# Patient Record
Sex: Female | Born: 1960 | Race: Black or African American | Hispanic: No | Marital: Married | Smoking: Former smoker
Health system: Southern US, Community
[De-identification: ages and names within clinical notes are randomized; demographics above are authoritative.]

## PROBLEM LIST (undated history)

## (undated) DIAGNOSIS — M542 Cervicalgia: Secondary | ICD-10-CM

## (undated) DIAGNOSIS — J45909 Unspecified asthma, uncomplicated: Secondary | ICD-10-CM

## (undated) DIAGNOSIS — D219 Benign neoplasm of connective and other soft tissue, unspecified: Secondary | ICD-10-CM

## (undated) DIAGNOSIS — F909 Attention-deficit hyperactivity disorder, unspecified type: Secondary | ICD-10-CM

## (undated) DIAGNOSIS — M1990 Unspecified osteoarthritis, unspecified site: Secondary | ICD-10-CM

## (undated) DIAGNOSIS — K5792 Diverticulitis of intestine, part unspecified, without perforation or abscess without bleeding: Secondary | ICD-10-CM

## (undated) DIAGNOSIS — N6019 Diffuse cystic mastopathy of unspecified breast: Secondary | ICD-10-CM

## (undated) HISTORY — PX: COLONOSCOPY: SHX174

---

## 2005-09-24 ENCOUNTER — Other Ambulatory Visit: Admission: RE | Admit: 2005-09-24 | Discharge: 2005-09-24 | Payer: Self-pay | Admitting: Obstetrics and Gynecology

## 2006-01-05 ENCOUNTER — Emergency Department (HOSPITAL_COMMUNITY): Admission: EM | Admit: 2006-01-05 | Discharge: 2006-01-05 | Payer: Self-pay | Admitting: Emergency Medicine

## 2006-01-12 ENCOUNTER — Emergency Department (HOSPITAL_COMMUNITY): Admission: EM | Admit: 2006-01-12 | Discharge: 2006-01-12 | Payer: Self-pay | Admitting: Emergency Medicine

## 2006-01-30 ENCOUNTER — Encounter: Admission: RE | Admit: 2006-01-30 | Discharge: 2006-01-30 | Payer: Self-pay | Admitting: Obstetrics and Gynecology

## 2006-09-23 ENCOUNTER — Emergency Department (HOSPITAL_COMMUNITY): Admission: EM | Admit: 2006-09-23 | Discharge: 2006-09-23 | Payer: Self-pay | Admitting: Emergency Medicine

## 2006-10-14 ENCOUNTER — Emergency Department (HOSPITAL_COMMUNITY): Admission: EM | Admit: 2006-10-14 | Discharge: 2006-10-14 | Payer: Self-pay | Admitting: Emergency Medicine

## 2007-09-28 ENCOUNTER — Emergency Department (HOSPITAL_COMMUNITY): Admission: EM | Admit: 2007-09-28 | Discharge: 2007-09-28 | Payer: Self-pay | Admitting: Emergency Medicine

## 2007-10-25 ENCOUNTER — Emergency Department (HOSPITAL_COMMUNITY): Admission: EM | Admit: 2007-10-25 | Discharge: 2007-10-25 | Payer: Self-pay | Admitting: Emergency Medicine

## 2008-03-19 ENCOUNTER — Emergency Department (HOSPITAL_COMMUNITY): Admission: EM | Admit: 2008-03-19 | Discharge: 2008-03-19 | Payer: Self-pay | Admitting: Emergency Medicine

## 2008-07-28 ENCOUNTER — Emergency Department (HOSPITAL_COMMUNITY): Admission: EM | Admit: 2008-07-28 | Discharge: 2008-07-28 | Payer: Self-pay | Admitting: Emergency Medicine

## 2008-10-21 ENCOUNTER — Emergency Department (HOSPITAL_COMMUNITY): Admission: EM | Admit: 2008-10-21 | Discharge: 2008-10-21 | Payer: Self-pay | Admitting: Emergency Medicine

## 2009-02-06 ENCOUNTER — Emergency Department (HOSPITAL_COMMUNITY): Admission: EM | Admit: 2009-02-06 | Discharge: 2009-02-06 | Payer: Self-pay | Admitting: Emergency Medicine

## 2009-08-13 ENCOUNTER — Observation Stay (HOSPITAL_COMMUNITY): Admission: EM | Admit: 2009-08-13 | Discharge: 2009-08-14 | Payer: Self-pay | Admitting: Emergency Medicine

## 2009-08-13 ENCOUNTER — Ambulatory Visit: Payer: Self-pay | Admitting: Cardiology

## 2010-03-01 ENCOUNTER — Inpatient Hospital Stay (HOSPITAL_COMMUNITY): Admission: EM | Admit: 2010-03-01 | Discharge: 2010-03-05 | Payer: Self-pay | Source: Home / Self Care

## 2010-03-01 LAB — CBC
HCT: 39.4 % (ref 36.0–46.0)
Hemoglobin: 13.1 g/dL (ref 12.0–15.0)
MCH: 25.8 pg — ABNORMAL LOW (ref 26.0–34.0)
MCHC: 33.2 g/dL (ref 30.0–36.0)
MCV: 77.6 fL — ABNORMAL LOW (ref 78.0–100.0)
Platelets: 248 10*3/uL (ref 150–400)
RBC: 5.08 MIL/uL (ref 3.87–5.11)
RDW: 15.3 % (ref 11.5–15.5)
WBC: 15.6 10*3/uL — ABNORMAL HIGH (ref 4.0–10.5)

## 2010-03-01 LAB — COMPREHENSIVE METABOLIC PANEL
ALT: 35 U/L (ref 0–35)
AST: 22 U/L (ref 0–37)
Albumin: 3.8 g/dL (ref 3.5–5.2)
Alkaline Phosphatase: 83 U/L (ref 39–117)
BUN: 9 mg/dL (ref 6–23)
CO2: 25 mEq/L (ref 19–32)
Calcium: 9.4 mg/dL (ref 8.4–10.5)
Chloride: 102 mEq/L (ref 96–112)
Creatinine, Ser: 0.7 mg/dL (ref 0.4–1.2)
GFR calc Af Amer: >60 mL/min (ref >60)
GFR calc non Af Amer: >60 mL/min (ref >60)
Glucose, Bld: 103 mg/dL — ABNORMAL HIGH (ref 70–99)
Potassium: 3.5 mEq/L (ref 3.5–5.1)
Sodium: 137 mEq/L (ref 135–145)
Total Bilirubin: 0.7 mg/dL (ref 0.3–1.2)
Total Protein: 8.1 g/dL (ref 6.0–8.3)

## 2010-03-01 LAB — DIFFERENTIAL
Basophils Absolute: 0 10*3/uL (ref 0.0–0.1)
Basophils Relative: 0 % (ref 0–1)
Eosinophils Absolute: 0.2 10*3/uL (ref 0.0–0.7)
Eosinophils Relative: 1 % (ref 0–5)
Lymphocytes Relative: 18 % (ref 12–46)
Lymphs Abs: 2.8 10*3/uL (ref 0.7–4.0)
Monocytes Absolute: 1.1 10*3/uL — ABNORMAL HIGH (ref 0.1–1.0)
Monocytes Relative: 7 % (ref 3–12)
Neutro Abs: 11.5 10*3/uL — ABNORMAL HIGH (ref 1.7–7.7)
Neutrophils Relative %: 74 % (ref 43–77)

## 2010-03-01 LAB — LIPASE, BLOOD: Lipase: 16 U/L (ref 11–59)

## 2010-03-01 LAB — URINALYSIS, ROUTINE W REFLEX MICROSCOPIC
Hemoglobin, Urine: NEGATIVE
Ketones, ur: 40 mg/dL — AB
Nitrite: NEGATIVE
Protein, ur: NEGATIVE mg/dL
Specific Gravity, Urine: 1.023 (ref 1.005–1.030)
Urine Glucose, Fasting: NEGATIVE mg/dL
Urobilinogen, UA: 1 mg/dL (ref 0.0–1.0)
pH: 5.5 (ref 5.0–8.0)

## 2010-03-01 LAB — URINE MICROSCOPIC-ADD ON

## 2010-03-11 LAB — CBC
HCT: 36.6 % (ref 36.0–46.0)
HCT: 37.7 % (ref 36.0–46.0)
Hemoglobin: 11.6 g/dL — ABNORMAL LOW (ref 12.0–15.0)
Hemoglobin: 12.1 g/dL (ref 12.0–15.0)
MCH: 24.6 pg — ABNORMAL LOW (ref 26.0–34.0)
MCH: 25 pg — ABNORMAL LOW (ref 26.0–34.0)
MCHC: 31.7 g/dL (ref 30.0–36.0)
MCHC: 32.1 g/dL (ref 30.0–36.0)
MCV: 77.7 fL — ABNORMAL LOW (ref 78.0–100.0)
MCV: 77.9 fL — ABNORMAL LOW (ref 78.0–100.0)
Platelets: 247 10*3/uL (ref 150–400)
Platelets: 298 10*3/uL (ref 150–400)
RBC: 4.71 MIL/uL (ref 3.87–5.11)
RBC: 4.84 MIL/uL (ref 3.87–5.11)
RDW: 15.1 % (ref 11.5–15.5)
RDW: 15.3 % (ref 11.5–15.5)
WBC: 9.5 10*3/uL (ref 4.0–10.5)
WBC: 6.2 10*3/uL (ref 4.0–10.5)

## 2010-03-11 LAB — BASIC METABOLIC PANEL
BUN: 5 mg/dL — ABNORMAL LOW (ref 6–23)
BUN: 1 mg/dL — ABNORMAL LOW (ref 6–23)
CO2: 23 mEq/L (ref 19–32)
CO2: 23 mEq/L (ref 19–32)
Calcium: 8.8 mg/dL (ref 8.4–10.5)
Calcium: 9.2 mg/dL (ref 8.4–10.5)
Chloride: 102 mEq/L (ref 96–112)
Chloride: 111 mEq/L (ref 96–112)
Creatinine, Ser: 0.66 mg/dL (ref 0.4–1.2)
Creatinine, Ser: 0.6 mg/dL (ref 0.4–1.2)
GFR calc Af Amer: >60 mL/min (ref >60)
GFR calc Af Amer: >60 mL/min (ref >60)
GFR calc non Af Amer: >60 mL/min (ref >60)
GFR calc non Af Amer: >60 mL/min (ref >60)
Glucose, Bld: 122 mg/dL — ABNORMAL HIGH (ref 70–99)
Glucose, Bld: 109 mg/dL — ABNORMAL HIGH (ref 70–99)
Potassium: 3.6 mEq/L (ref 3.5–5.1)
Potassium: 4 mEq/L (ref 3.5–5.1)
Sodium: 136 mEq/L (ref 135–145)
Sodium: 138 mEq/L (ref 135–145)

## 2010-03-16 ENCOUNTER — Encounter: Payer: Self-pay | Admitting: Family Medicine

## 2010-05-12 LAB — POCT CARDIAC MARKERS
CKMB, poc: 1 ng/mL (ref 1.0–8.0)
CKMB, poc: <1 ng/mL — ABNORMAL LOW (ref 1.0–8.0)
Myoglobin, poc: 61.8 ng/mL (ref 12–200)
Troponin i, poc: <0.05 ng/mL (ref 0.00–0.09)
Troponin i, poc: <0.05 ng/mL (ref 0.00–0.09)
Troponin i, poc: <0.05 ng/mL (ref 0.00–0.09)

## 2010-05-12 LAB — URINALYSIS, ROUTINE W REFLEX MICROSCOPIC
Bilirubin Urine: NEGATIVE
Glucose, UA: NEGATIVE mg/dL
Hgb urine dipstick: NEGATIVE
Ketones, ur: NEGATIVE mg/dL
Nitrite: NEGATIVE
Protein, ur: NEGATIVE mg/dL
Specific Gravity, Urine: 1.021 (ref 1.005–1.030)
Urobilinogen, UA: 0.2 mg/dL (ref 0.0–1.0)
pH: 5 (ref 5.0–8.0)

## 2010-05-12 LAB — PROTIME-INR
INR: 0.99 (ref 0.00–1.49)
Prothrombin Time: 13 seconds (ref 11.6–15.2)

## 2010-05-12 LAB — POCT I-STAT, CHEM 8
BUN: 7 mg/dL (ref 6–23)
Calcium, Ion: 1.12 mmol/L (ref 1.12–1.32)
Chloride: 106 mEq/L (ref 96–112)
Creatinine, Ser: 0.6 mg/dL (ref 0.4–1.2)
Glucose, Bld: 92 mg/dL (ref 70–99)
HCT: 39 % (ref 36.0–46.0)
Hemoglobin: 13.3 g/dL (ref 12.0–15.0)
Potassium: 4 mEq/L (ref 3.5–5.1)
Sodium: 139 mEq/L (ref 135–145)
TCO2: 26 mmol/L (ref 0–100)

## 2010-05-12 LAB — POCT PREGNANCY, URINE: Preg Test, Ur: NEGATIVE

## 2010-05-12 LAB — APTT: aPTT: 25 seconds (ref 24–37)

## 2010-07-30 ENCOUNTER — Other Ambulatory Visit (HOSPITAL_COMMUNITY)
Admission: RE | Admit: 2010-07-30 | Discharge: 2010-07-30 | Disposition: A | Discharge status: Home / Self Care | Payer: BC Managed Care – PPO | Source: Ambulatory Visit | Attending: Family Medicine | Admitting: Family Medicine

## 2010-07-30 DIAGNOSIS — Z124 Encounter for screening for malignant neoplasm of cervix: Secondary | ICD-10-CM | POA: Insufficient documentation

## 2010-08-12 ENCOUNTER — Other Ambulatory Visit: Payer: Self-pay | Admitting: Family Medicine

## 2010-08-12 DIAGNOSIS — Z1231 Encounter for screening mammogram for malignant neoplasm of breast: Secondary | ICD-10-CM

## 2010-08-16 ENCOUNTER — Ambulatory Visit
Admission: RE | Admit: 2010-08-16 | Discharge: 2010-08-16 | Disposition: A | Discharge status: Home / Self Care | Payer: BC Managed Care – PPO | Source: Ambulatory Visit | Attending: Family Medicine | Admitting: Family Medicine

## 2010-08-16 DIAGNOSIS — Z1231 Encounter for screening mammogram for malignant neoplasm of breast: Secondary | ICD-10-CM

## 2010-08-26 ENCOUNTER — Ambulatory Visit: Payer: Self-pay

## 2010-11-22 LAB — URINALYSIS, ROUTINE W REFLEX MICROSCOPIC
Bilirubin Urine: NEGATIVE
Ketones, ur: NEGATIVE
Nitrite: NEGATIVE
Specific Gravity, Urine: 1.021
Urobilinogen, UA: 1

## 2010-12-03 ENCOUNTER — Emergency Department (HOSPITAL_COMMUNITY)
Admission: EM | Admit: 2010-12-03 | Discharge: 2010-12-03 | Disposition: A | Discharge status: Home / Self Care | Payer: BC Managed Care – PPO | Attending: Emergency Medicine | Admitting: Emergency Medicine

## 2010-12-03 DIAGNOSIS — M76899 Other specified enthesopathies of unspecified lower limb, excluding foot: Secondary | ICD-10-CM | POA: Insufficient documentation

## 2010-12-03 DIAGNOSIS — M25569 Pain in unspecified knee: Secondary | ICD-10-CM | POA: Insufficient documentation

## 2010-12-03 DIAGNOSIS — J45909 Unspecified asthma, uncomplicated: Secondary | ICD-10-CM | POA: Insufficient documentation

## 2010-12-03 DIAGNOSIS — M25469 Effusion, unspecified knee: Secondary | ICD-10-CM | POA: Insufficient documentation

## 2011-07-29 ENCOUNTER — Other Ambulatory Visit (HOSPITAL_COMMUNITY)
Admission: RE | Admit: 2011-07-29 | Discharge: 2011-07-29 | Disposition: A | Discharge status: Home / Self Care | Payer: BC Managed Care – PPO | Source: Ambulatory Visit | Attending: Family Medicine | Admitting: Family Medicine

## 2011-07-29 DIAGNOSIS — Z124 Encounter for screening for malignant neoplasm of cervix: Secondary | ICD-10-CM | POA: Insufficient documentation

## 2011-09-08 ENCOUNTER — Other Ambulatory Visit: Payer: Self-pay | Admitting: Family Medicine

## 2011-09-08 DIAGNOSIS — Z1231 Encounter for screening mammogram for malignant neoplasm of breast: Secondary | ICD-10-CM

## 2011-09-24 ENCOUNTER — Ambulatory Visit: Payer: BC Managed Care – PPO

## 2012-03-25 ENCOUNTER — Ambulatory Visit
Admission: RE | Admit: 2012-03-25 | Discharge: 2012-03-25 | Disposition: A | Discharge status: Home / Self Care | Payer: BC Managed Care – PPO | Source: Ambulatory Visit | Attending: Family Medicine | Admitting: Family Medicine

## 2012-03-25 DIAGNOSIS — Z1231 Encounter for screening mammogram for malignant neoplasm of breast: Secondary | ICD-10-CM

## 2012-03-26 ENCOUNTER — Other Ambulatory Visit: Payer: Self-pay | Admitting: Family Medicine

## 2012-03-26 DIAGNOSIS — R928 Other abnormal and inconclusive findings on diagnostic imaging of breast: Secondary | ICD-10-CM

## 2012-04-06 ENCOUNTER — Other Ambulatory Visit: Payer: Self-pay | Admitting: Family Medicine

## 2012-04-06 ENCOUNTER — Other Ambulatory Visit: Payer: Self-pay | Admitting: Internal Medicine

## 2012-04-06 DIAGNOSIS — R928 Other abnormal and inconclusive findings on diagnostic imaging of breast: Secondary | ICD-10-CM

## 2012-04-09 ENCOUNTER — Other Ambulatory Visit: Payer: BC Managed Care – PPO

## 2012-04-15 ENCOUNTER — Other Ambulatory Visit: Payer: BC Managed Care – PPO

## 2012-04-26 ENCOUNTER — Ambulatory Visit
Admission: RE | Admit: 2012-04-26 | Discharge: 2012-04-26 | Disposition: A | Discharge status: Home / Self Care | Payer: BC Managed Care – PPO | Source: Ambulatory Visit | Attending: Family Medicine | Admitting: Family Medicine

## 2012-04-26 DIAGNOSIS — R928 Other abnormal and inconclusive findings on diagnostic imaging of breast: Secondary | ICD-10-CM

## 2012-08-02 ENCOUNTER — Other Ambulatory Visit (HOSPITAL_COMMUNITY)
Admission: RE | Admit: 2012-08-02 | Discharge: 2012-08-02 | Disposition: A | Discharge status: Home / Self Care | Payer: BC Managed Care – PPO | Source: Ambulatory Visit | Attending: Family Medicine | Admitting: Family Medicine

## 2012-08-02 ENCOUNTER — Other Ambulatory Visit: Payer: Self-pay | Admitting: Physician Assistant

## 2012-08-02 DIAGNOSIS — Z124 Encounter for screening for malignant neoplasm of cervix: Secondary | ICD-10-CM | POA: Insufficient documentation

## 2012-12-14 ENCOUNTER — Emergency Department (HOSPITAL_COMMUNITY): Payer: BC Managed Care – PPO

## 2012-12-14 ENCOUNTER — Encounter (HOSPITAL_COMMUNITY): Payer: Self-pay | Admitting: Emergency Medicine

## 2012-12-14 ENCOUNTER — Emergency Department (HOSPITAL_COMMUNITY)
Admission: EM | Admit: 2012-12-14 | Discharge: 2012-12-14 | Disposition: A | Discharge status: Home / Self Care | Payer: BC Managed Care – PPO | Attending: Emergency Medicine | Admitting: Emergency Medicine

## 2012-12-14 DIAGNOSIS — Z79899 Other long term (current) drug therapy: Secondary | ICD-10-CM | POA: Insufficient documentation

## 2012-12-14 DIAGNOSIS — M719 Bursopathy, unspecified: Secondary | ICD-10-CM | POA: Insufficient documentation

## 2012-12-14 DIAGNOSIS — M67919 Unspecified disorder of synovium and tendon, unspecified shoulder: Secondary | ICD-10-CM | POA: Insufficient documentation

## 2012-12-14 MED ORDER — HYDROCODONE-ACETAMINOPHEN 5-325 MG PO TABS: 2.0000 | ORAL_TABLET | ORAL | Status: DC | PRN

## 2012-12-14 MED ORDER — DIAZEPAM 5 MG PO TABS: 5.0000 mg | ORAL_TABLET | Freq: Two times a day (BID) | ORAL | Status: DC

## 2012-12-14 MED ORDER — KETOROLAC TROMETHAMINE 60 MG/2ML IM SOLN
60.0000 mg | Freq: Once | INTRAMUSCULAR | Status: AC
  Administered 2012-12-14: 60 mg via INTRAMUSCULAR
  Filled 2012-12-14: qty 2

## 2012-12-14 NOTE — ED Notes (Signed)
Pt reports right shoulder pain for the past week. Reports that she thinks that she slept funny and has tried everything at home without relief. States that she has increased pain with movement. No other complaints.

## 2012-12-14 NOTE — ED Provider Notes (Signed)
CSN: [id number]     Arrival date & time 12/14/12  1153 History   This chart was scribed for non-physician practitioner Irish Elders, FNP, working with Bonnita Levan. Bernette Mayers, MD, by Yevette Edwards, ED Scribe. This patient was seen in room TR10C/TR10C and the patient's care was started at 12:57 PM.  None    Chief Complaint  Patient presents with  . Shoulder Pain    The history is provided by the patient. No language interpreter was used.  HPI Comments: Lauren Nicholson is a 52 y.o. female who presents to the Emergency Department complaining of diffuse pain to her right shoulder which has been gradually increasing for the past week. The pt wonders if she irritated her shoulder by sleeping on it incorrectly. She denies any recent falls or injuries, including any past injuries. She reports that movement increases the pain.  She reports that she intermittently experiences numbness and paresthesia to her hands bilaterally while she is sleeping. For work, the pt mops, vacuums, sweeps, and cleans blackboards. She has attempted to mitigate the pain with one dosage of aleve, but without relief. She is right-handed. The pt is a non-smoker.    History reviewed. No pertinent past medical history. History reviewed. No pertinent past surgical history. History reviewed. No pertinent family history. History  Substance Use Topics  . Smoking status: Never Smoker   . Smokeless tobacco: Not on file  . Alcohol Use: No   No OB history provided.  Review of Systems  Constitutional: Negative for fever and chills.  Musculoskeletal: Positive for arthralgias.  Neurological: Negative for weakness and numbness.    Allergies  Ciprofloxacin  Home Medications   Current Outpatient Rx  Name  Route  Sig  Dispense  Refill  . ADVAIR DISKUS 100-50 MCG/DOSE AEPB   Inhalation   Inhale 1 puff into the lungs 2 (two) times daily.         . naproxen sodium (ANAPROX) 220 MG tablet   Oral   Take 440 mg by mouth daily as  needed (for pain).          Triage Vitals: BP 113/82  Pulse 88  Temp(Src) 98 F (36.7 C) (Oral)  Resp 18  Ht 4\' 11"  (1.499 m)  Wt 194 lb 5 oz (88.14 kg)  BMI 39.23 kg/m2  SpO2 91%  Physical Exam  Nursing note and vitals reviewed. Constitutional: She is oriented to person, place, and time. She appears well-developed and well-nourished. No distress.  HENT:  Head: Normocephalic and atraumatic.  Eyes: EOM are normal.  Neck: Neck supple. No tracheal deviation present.  Cardiovascular: Normal rate.   Pulmonary/Chest: Effort normal. No respiratory distress.  Musculoskeletal: Normal range of motion.  No tenderness to anterior shoulder. No tenderness to scapula. Top of shoulder tender to palpation.  Neurological: She is alert and oriented to person, place, and time.  Skin: Skin is warm and dry.  Psychiatric: She has a normal mood and affect. Her behavior is normal.    ED Course  Procedures (including critical care time)  DIAGNOSTIC STUDIES: Oxygen Saturation is 91% on room air, normal by my interpretation.    COORDINATION OF CARE:  1:03 PM- Discussed treatment plan with patient which includes continued use of aleve along with icing and heat, the use of a sling, and a prescription for aleve, and the patient agreed to the plan.   Labs Review Labs Reviewed - No data to display Imaging Review No results found.  EKG Interpretation   None  MDM   1. Tendonitis of shoulder, right    Has a lot of repetative movements in her job. No numbness or tingling in her fingers today. Very limited ROM in her right shoulder. Suspect rotator cuff tendonitis. Sling for the next few days and then NSAID's and valium at night.   I personally performed the services described in this documentation, which was scribed in my presence. The recorded information has been reviewed and is accurate.     Irish Elders, NP 12/14/12 1311

## 2012-12-14 NOTE — ED Provider Notes (Signed)
Medical screening examination/treatment/procedure(s) were performed by non-physician practitioner and as supervising physician I was immediately available for consultation/collaboration.   Matasha Smigelski B. Chemeka Filice, MD 12/14/12 1549 

## 2013-04-18 ENCOUNTER — Emergency Department (HOSPITAL_COMMUNITY)
Admission: EM | Admit: 2013-04-18 | Discharge: 2013-04-18 | Disposition: A | Discharge status: Home / Self Care | Payer: BC Managed Care – PPO | Attending: Emergency Medicine | Admitting: Emergency Medicine

## 2013-04-18 ENCOUNTER — Emergency Department (HOSPITAL_COMMUNITY): Payer: BC Managed Care – PPO

## 2013-04-18 ENCOUNTER — Encounter (HOSPITAL_COMMUNITY): Payer: Self-pay | Admitting: Emergency Medicine

## 2013-04-18 DIAGNOSIS — Z79899 Other long term (current) drug therapy: Secondary | ICD-10-CM | POA: Insufficient documentation

## 2013-04-18 DIAGNOSIS — R109 Unspecified abdominal pain: Secondary | ICD-10-CM

## 2013-04-18 DIAGNOSIS — N83209 Unspecified ovarian cyst, unspecified side: Secondary | ICD-10-CM | POA: Insufficient documentation

## 2013-04-18 DIAGNOSIS — N83201 Unspecified ovarian cyst, right side: Secondary | ICD-10-CM

## 2013-04-18 DIAGNOSIS — Z8719 Personal history of other diseases of the digestive system: Secondary | ICD-10-CM | POA: Insufficient documentation

## 2013-04-18 DIAGNOSIS — IMO0002 Reserved for concepts with insufficient information to code with codable children: Secondary | ICD-10-CM | POA: Insufficient documentation

## 2013-04-18 DIAGNOSIS — D259 Leiomyoma of uterus, unspecified: Secondary | ICD-10-CM | POA: Insufficient documentation

## 2013-04-18 HISTORY — DX: Diverticulitis of intestine, part unspecified, without perforation or abscess without bleeding: K57.92

## 2013-04-18 LAB — WET PREP, GENITAL
Trich, Wet Prep: NONE SEEN
YEAST WET PREP: NONE SEEN

## 2013-04-18 LAB — CBC WITH DIFFERENTIAL/PLATELET
Basophils Absolute: 0 10*3/uL (ref 0.0–0.1)
Basophils Relative: 0 % (ref 0–1)
Eosinophils Absolute: 0.2 10*3/uL (ref 0.0–0.7)
Eosinophils Relative: 2 % (ref 0–5)
HCT: 43.2 % (ref 36.0–46.0)
Hemoglobin: 14.2 g/dL (ref 12.0–15.0)
LYMPHS ABS: 3.4 10*3/uL (ref 0.7–4.0)
LYMPHS PCT: 36 % (ref 12–46)
MCH: 27 pg (ref 26.0–34.0)
MCHC: 32.9 g/dL (ref 30.0–36.0)
MCV: 82.3 fL (ref 78.0–100.0)
Monocytes Absolute: 0.5 10*3/uL (ref 0.1–1.0)
Monocytes Relative: 5 % (ref 3–12)
NEUTROS ABS: 5.4 10*3/uL (ref 1.7–7.7)
Neutrophils Relative %: 57 % (ref 43–77)
PLATELETS: 271 10*3/uL (ref 150–400)
RBC: 5.25 MIL/uL — AB (ref 3.87–5.11)
RDW: 15.3 % (ref 11.5–15.5)
WBC: 9.5 10*3/uL (ref 4.0–10.5)

## 2013-04-18 LAB — COMPREHENSIVE METABOLIC PANEL
ALT: 25 U/L (ref 0–35)
AST: 24 U/L (ref 0–37)
Albumin: 4 g/dL (ref 3.5–5.2)
Alkaline Phosphatase: 91 U/L (ref 39–117)
BUN: 10 mg/dL (ref 6–23)
CALCIUM: 10 mg/dL (ref 8.4–10.5)
CO2: 23 meq/L (ref 19–32)
CREATININE: 0.6 mg/dL (ref 0.50–1.10)
Chloride: 104 mEq/L (ref 96–112)
GLUCOSE: 86 mg/dL (ref 70–99)
Potassium: 4.5 mEq/L (ref 3.7–5.3)
Sodium: 142 mEq/L (ref 137–147)
TOTAL PROTEIN: 7.9 g/dL (ref 6.0–8.3)
Total Bilirubin: 0.4 mg/dL (ref 0.3–1.2)

## 2013-04-18 LAB — URINALYSIS, ROUTINE W REFLEX MICROSCOPIC
Bilirubin Urine: NEGATIVE
Glucose, UA: NEGATIVE mg/dL
Hgb urine dipstick: NEGATIVE
Ketones, ur: NEGATIVE mg/dL
NITRITE: NEGATIVE
PH: 7 (ref 5.0–8.0)
Protein, ur: NEGATIVE mg/dL
SPECIFIC GRAVITY, URINE: 1.019 (ref 1.005–1.030)
UROBILINOGEN UA: 1 mg/dL (ref 0.0–1.0)

## 2013-04-18 LAB — LIPASE, BLOOD: LIPASE: 18 U/L (ref 11–59)

## 2013-04-18 LAB — URINE MICROSCOPIC-ADD ON

## 2013-04-18 MED ORDER — HYDROCODONE-ACETAMINOPHEN 5-325 MG PO TABS: 1.0000 | ORAL_TABLET | Freq: Four times a day (QID) | ORAL | Status: DC | PRN

## 2013-04-18 MED ORDER — MORPHINE SULFATE 4 MG/ML IJ SOLN
4.0000 mg | Freq: Once | INTRAMUSCULAR | Status: AC
  Administered 2013-04-18: 4 mg via INTRAVENOUS
  Filled 2013-04-18: qty 1

## 2013-04-18 MED ORDER — IOHEXOL 300 MG/ML  SOLN
25.0000 mL | INTRAMUSCULAR | Status: AC
  Administered 2013-04-18: 25 mL via ORAL

## 2013-04-18 MED ORDER — METRONIDAZOLE 500 MG PO TABS: 500.0000 mg | ORAL_TABLET | Freq: Two times a day (BID) | ORAL | Status: DC

## 2013-04-18 MED ORDER — ONDANSETRON HCL 4 MG/2ML IJ SOLN
4.0000 mg | INTRAMUSCULAR | Status: AC
  Administered 2013-04-18: 4 mg via INTRAVENOUS
  Filled 2013-04-18: qty 2

## 2013-04-18 MED ORDER — IBUPROFEN 600 MG PO TABS: 600.0000 mg | ORAL_TABLET | Freq: Four times a day (QID) | ORAL | Status: DC | PRN

## 2013-04-18 MED ORDER — IOHEXOL 300 MG/ML  SOLN
100.0000 mL | Freq: Once | INTRAMUSCULAR | Status: AC | PRN
  Administered 2013-04-18: 100 mL via INTRAVENOUS

## 2013-04-18 NOTE — Discharge Instructions (Signed)
Take ibuprofen for pain control. You may take Norco if pain is not controlled with ibuprofen. Followup with your OB/GYN. Return if symptoms worsen.  Abdominal Pain, Women Abdominal (stomach, pelvic, or belly) pain can be caused by many things. It is important to tell your doctor:  The location of the pain.  Does it come and go or is it present all the time?  Are there things that start the pain (eating certain foods, exercise)?  Are there other symptoms associated with the pain (fever, nausea, vomiting, diarrhea)? All of this is helpful to know when trying to find the cause of the pain. CAUSES   Stomach: virus or bacteria infection, or ulcer.  Intestine: appendicitis (inflamed appendix), regional ileitis (Crohn's disease), ulcerative colitis (inflamed colon), irritable bowel syndrome, diverticulitis (inflamed diverticulum of the colon), or cancer of the stomach or intestine.  Gallbladder disease or stones in the gallbladder.  Kidney disease, kidney stones, or infection.  Pancreas infection or cancer.  Fibromyalgia (pain disorder).  Diseases of the female organs:  Uterus: fibroid (non-cancerous) tumors or infection.  Fallopian tubes: infection or tubal pregnancy.  Ovary: cysts or tumors.  Pelvic adhesions (scar tissue).  Endometriosis (uterus lining tissue growing in the pelvis and on the pelvic organs).  Pelvic congestion syndrome (female organs filling up with blood just before the menstrual period).  Pain with the menstrual period.  Pain with ovulation (producing an egg).  Pain with an IUD (intrauterine device, birth control) in the uterus.  Cancer of the female organs.  Functional pain (pain not caused by a disease, may improve without treatment).  Psychological pain.  Depression. DIAGNOSIS  Your doctor will decide the seriousness of your pain by doing an examination.  Blood tests.  X-rays.  Ultrasound.  CT scan (computed tomography, special type of  X-ray).  MRI (magnetic resonance imaging).  Cultures, for infection.  Barium enema (dye inserted in the large intestine, to better view it with X-rays).  Colonoscopy (looking in intestine with a lighted tube).  Laparoscopy (minor surgery, looking in abdomen with a lighted tube).  Major abdominal exploratory surgery (looking in abdomen with a large incision). TREATMENT  The treatment will depend on the cause of the pain.   Many cases can be observed and treated at home.  Over-the-counter medicines recommended by your caregiver.  Prescription medicine.  Antibiotics, for infection.  Birth control pills, for painful periods or for ovulation pain.  Hormone treatment, for endometriosis.  Nerve blocking injections.  Physical therapy.  Antidepressants.  Counseling with a psychologist or psychiatrist.  Minor or major surgery. HOME CARE INSTRUCTIONS   Do not take laxatives, unless directed by your caregiver.  Take over-the-counter pain medicine only if ordered by your caregiver. Do not take aspirin because it can cause an upset stomach or bleeding.  Try a clear liquid diet (broth or water) as ordered by your caregiver. Slowly move to a bland diet, as tolerated, if the pain is related to the stomach or intestine.  Have a thermometer and take your temperature several times a day, and record it.  Bed rest and sleep, if it helps the pain.  Avoid sexual intercourse, if it causes pain.  Avoid stressful situations.  Keep your follow-up appointments and tests, as your caregiver orders.  If the pain does not go away with medicine or surgery, you may try:  Acupuncture.  Relaxation exercises (yoga, meditation).  Group therapy.  Counseling. SEEK MEDICAL CARE IF:   You notice certain foods cause stomach pain.  Your  home care treatment is not helping your pain.  You need stronger pain medicine.  You want your IUD removed.  You feel faint or lightheaded.  You  develop nausea and vomiting.  You develop a rash.  You are having side effects or an allergy to your medicine. SEEK IMMEDIATE MEDICAL CARE IF:   Your pain does not go away or gets worse.  You have a fever.  Your pain is felt only in portions of the abdomen. The right side could possibly be appendicitis. The left lower portion of the abdomen could be colitis or diverticulitis.  You are passing blood in your stools (bright red or black tarry stools, with or without vomiting).  You have blood in your urine.  You develop chills, with or without a fever.  You pass out. MAKE SURE YOU:   Understand these instructions.  Will watch your condition.  Will get help right away if you are not doing well or get worse. Document Released: 12/08/2006 Document Revised: 05/05/2011 Document Reviewed: 12/28/2008 [facility] Patient Information 2014 [place], [place].

## 2013-04-18 NOTE — ED Notes (Signed)
Patient transported to CT 

## 2013-04-18 NOTE — ED Provider Notes (Signed)
CSN: [id number]     Arrival date & time 04/18/13  9983 History   First MD Initiated Contact with Patient 04/18/13 1120     Chief Complaint  Patient presents with  . Abdominal Pain     (Consider location/radiation/quality/duration/timing/severity/associated sxs/prior Treatment) HPI Comments: Patient is a 53 year old female with a history of diverticulitis who presents to the emergency department for right lower quadrant pain x7 days. Patient states the pain has been gradually worsening since onset without any alleviating factors. Patient has not taken anything for symptoms because she thought that pain was "just gas". She endorses having similar pain in the past which was related to gas/bloating. She denies bloating sensation or abdominal distension since symptom onset. Patient states that pain is worse with movement and palpation to the area. She denies associated fever, chest pain or shortness of breath, nausea, vomiting, diarrhea, melena or hematochezia, vaginal bleeding or discharge, dysuria or hematuria, numbness/tingling, and weakness. She denies a history of abdominal surgeries.  Patient is a 53 y.o. female presenting with abdominal pain. The history is provided by the patient. No language interpreter was used.  Abdominal Pain   Past Medical History  Diagnosis Date  . Diverticulitis    History reviewed. No pertinent past surgical history. History reviewed. No pertinent family history. History  Substance Use Topics  . Smoking status: Never Smoker   . Smokeless tobacco: Not on file  . Alcohol Use: No   OB History   Grav Para Term Preterm Abortions TAB SAB Ect Mult Living                 Review of Systems  Gastrointestinal: Positive for abdominal pain.  All other systems reviewed and are negative.     Allergies  Ciprofloxacin  Home Medications   Current Outpatient Rx  Name  Route  Sig  Dispense  Refill  . ADVAIR DISKUS 100-50 MCG/DOSE AEPB   Inhalation   Inhale 1  puff into the lungs daily.          . Beclomethasone Dipropionate (QVAR IN)   Inhalation   Inhale 1 puff into the lungs 2 (two) times daily.         . Calcium Carbonate (CALCIUM 600 PO)   Oral   Take 600 mg by mouth 3 (three) times daily.         Marland Kitchen COENZYME Q-10 PO   Oral   Take 10 mLs by mouth daily.         . Multiple Vitamin (MULTIVITAMIN WITH MINERALS) TABS tablet   Oral   Take 1 tablet by mouth daily.         . Multiple Vitamins-Minerals (HAIR/SKIN/NAILS) TABS   Oral   Take 3 tablets by mouth daily.          BP 140/86  Pulse 79  Temp(Src) 97.9 F (36.6 C) (Oral)  Resp 22  Ht 4\' 11"  (1.499 m)  Wt 187 lb (84.823 kg)  BMI 37.75 kg/m2  SpO2 97%  Physical Exam  Nursing note and vitals reviewed. Constitutional: She is oriented to person, place, and time. She appears well-developed and well-nourished. No distress.  HENT:  Head: Normocephalic and atraumatic.  Eyes: Conjunctivae and EOM are normal. No scleral icterus.  Neck: Normal range of motion.  Cardiovascular: Normal rate, regular rhythm and intact distal pulses.   Pulmonary/Chest: Effort normal. No respiratory distress.  Abdominal: Soft. She exhibits no distension and no mass. There is tenderness (TTP at McBurney's point). There is no  rebound and no guarding.  Abdomen soft. Tenderness appreciated in the right lower quadrant at McBurney's point. Negative Murphy's sign. No peritoneal signs.  Genitourinary: Vagina normal. There is no rash, tenderness, lesion or injury on the right labia. There is no rash, tenderness, lesion or injury on the left labia. Uterus is not tender. Cervix exhibits no motion tenderness and no friability. Right adnexum displays no mass, no tenderness and no fullness. Left adnexum displays no mass, no tenderness and no fullness.  Musculoskeletal: Normal range of motion.  Neurological: She is alert and oriented to person, place, and time.  Skin: Skin is warm and dry. No rash noted. She  is not diaphoretic. No erythema. No pallor.  Psychiatric: She has a normal mood and affect. Her behavior is normal.    ED Course  Procedures (including critical care time) Labs Review Labs Reviewed  URINALYSIS, ROUTINE W REFLEX MICROSCOPIC - Abnormal; Notable for the following:    APPearance HAZY (*)    Leukocytes, UA SMALL (*)    All other components within normal limits  URINE MICROSCOPIC-ADD ON - Abnormal; Notable for the following:    Squamous Epithelial / LPF FEW (*)    Bacteria, UA FEW (*)    All other components within normal limits  CBC WITH DIFFERENTIAL - Abnormal; Notable for the following:    RBC 5.25 (*)    All other components within normal limits  WET PREP, GENITAL  GC/CHLAMYDIA PROBE AMP  COMPREHENSIVE METABOLIC PANEL  LIPASE, BLOOD  CBC WITH DIFFERENTIAL   Imaging Review Ct Abdomen Pelvis W Contrast  04/18/2013   CLINICAL DATA:  53 year old female with right lower quadrant abdominal pain. Initial encounter.  EXAM: CT ABDOMEN AND PELVIS WITH CONTRAST  TECHNIQUE: Multidetector CT imaging of the abdomen and pelvis was performed using the standard protocol following bolus administration of intravenous contrast.  CONTRAST:  168mL OMNIPAQUE IOHEXOL 300 MG/ML  SOLN  COMPARISON:  03/01/2010.  FINDINGS: Mild cardiomegaly. No pericardial or pleural effusion. Minor lung base atelectasis.  Moderate to severe L5-S1 facet degeneration. Vacuum facet phenomena. No acute osseous abnormality identified.  No pelvic free fluid.  Negative distal colon.  Unremarkable bladder and left ovary. Fibroid uterus, 56 mm diameter fibroid along the right posterior aspect of the uterus appears stable since 2012. There is a chronic right adnexal cystic lesion with simple fluid densitometry today measuring 34 x 48 mm (32 x 44 mm previously). No pelvic sidewall lymphadenopathy.  Sigmoid diverticulosis without active inflammation. Negative left colon. Oral contrast has reached the splenic flexure. Negative  transverse colon. Negative right colon and appendix. The cecum is on a lax mesentery. No dilated small bowel. No inflamed small bowel identified. Decompressed stomach and duodenum.  Stable liver with small 8 mm cyst or biliary hamartoma in the superficial right lobe. Negative gallbladder, spleen, pancreas, adrenal glands, and portal venous system. Major arterial structures in the abdomen and pelvis appear patent. No abdominal free fluid. Stable and negative kidneys.  IMPRESSION: 1. No acute or inflammatory process identified.  Normal appendix. 2. Chronic right adnexal cyst which was evaluated with pelvic ultrasound in 11/2020 14, with yearly ultrasound followup recommended (please see that report). 3. Fibroid uterus.  Sigmoid diverticulosis.   Electronically Signed   By: Lars Pinks M.D.   On: 04/18/2013 14:47    EKG Interpretation   None       MDM   Final diagnoses:  Abdominal pain  Ovarian cyst, right  Fibroid uterus   Patient is a 53 year old  female who presents to the emergency department for right lower abdominal pain x1 week. Patient denies associated fever, nausea, vomiting, and diarrhea. No melena or hematochezia. On arrival patient is well and nontoxic appearing, hemodynamically stable, and afebrile. Physical exam significant for focal tenderness in the right lower cautery and appreciated to be at McBurney's point. No peritoneal signs appreciated on exam. Patient without adnexal or cervical motion tenderness today.  Labs and CT scan ordered for further evaluation of patient's symptoms. Patient today has no leukocytosis, anemia, or electrolyte imbalance. Liver and kidney function preserved and lipase WNL. Urinalysis today does not suggest infection. CT abdomen and pelvis pending.  CT scan negative for acute intra-abdominal processes. Appendix is normal appearing. No evidence of bowel obstruction or hernia. Patient does have a chronic right adnexal cyst without any additional changes noted  to the adnexa. Patient also with fibroid uterus. Wet prep is currently pending. Have reviewed findings with patient who verbalizes understanding. Have discussed plan which includes OB/GYN followup as an outpatient if wet prep unremarkable. Patient agreeable to plan with no unaddressed concerns.  66 - Dr. Judithann Graves to f/u on results of wet prep. Patient stable and appropriate for d/c if wet prep unremarkable. Patient advised OBGYN follow up as outpatient.  Antonietta Breach, PA-C 04/18/13 1547

## 2013-04-18 NOTE — ED Notes (Signed)
PT states that she has a hx of diverticulitis, but it was on the other side of her abdomen. Pt states it hurts to move, lie down, and sit, but denies more pain with palpation.

## 2013-04-18 NOTE — ED Notes (Signed)
RLQ pain since Tuesday. Hx of diverticulitis with similar presentation  2012. NO n/v/d. Afebrile. ambulatory

## 2013-04-19 LAB — GC/CHLAMYDIA PROBE AMP
CT PROBE, AMP APTIMA: NEGATIVE
GC Probe RNA: NEGATIVE

## 2013-04-19 NOTE — ED Provider Notes (Signed)
Medical screening examination/treatment/procedure(s) were performed by non-physician practitioner and as supervising physician I was immediately available for consultation/collaboration.  EKG Interpretation   None        Jasper Riling. Alvino Chapel, MD 04/19/13 [phone]

## 2013-06-17 ENCOUNTER — Emergency Department (HOSPITAL_COMMUNITY): Payer: BC Managed Care – PPO

## 2013-06-17 ENCOUNTER — Encounter (HOSPITAL_COMMUNITY): Payer: Self-pay | Admitting: Emergency Medicine

## 2013-06-17 ENCOUNTER — Emergency Department (HOSPITAL_COMMUNITY)
Admission: EM | Admit: 2013-06-17 | Discharge: 2013-06-17 | Disposition: A | Discharge status: Home / Self Care | Payer: BC Managed Care – PPO | Attending: Emergency Medicine | Admitting: Emergency Medicine

## 2013-06-17 DIAGNOSIS — R0789 Other chest pain: Secondary | ICD-10-CM

## 2013-06-17 DIAGNOSIS — N644 Mastodynia: Secondary | ICD-10-CM | POA: Insufficient documentation

## 2013-06-17 DIAGNOSIS — R079 Chest pain, unspecified: Secondary | ICD-10-CM

## 2013-06-17 DIAGNOSIS — Z792 Long term (current) use of antibiotics: Secondary | ICD-10-CM | POA: Insufficient documentation

## 2013-06-17 DIAGNOSIS — IMO0002 Reserved for concepts with insufficient information to code with codable children: Secondary | ICD-10-CM | POA: Insufficient documentation

## 2013-06-17 DIAGNOSIS — J45901 Unspecified asthma with (acute) exacerbation: Secondary | ICD-10-CM | POA: Insufficient documentation

## 2013-06-17 DIAGNOSIS — E669 Obesity, unspecified: Secondary | ICD-10-CM | POA: Insufficient documentation

## 2013-06-17 DIAGNOSIS — K5732 Diverticulitis of large intestine without perforation or abscess without bleeding: Secondary | ICD-10-CM | POA: Insufficient documentation

## 2013-06-17 DIAGNOSIS — Z79899 Other long term (current) drug therapy: Secondary | ICD-10-CM | POA: Insufficient documentation

## 2013-06-17 DIAGNOSIS — R071 Chest pain on breathing: Secondary | ICD-10-CM | POA: Insufficient documentation

## 2013-06-17 HISTORY — DX: Unspecified asthma, uncomplicated: J45.909

## 2013-06-17 LAB — CBC
HCT: 39.9 % (ref 36.0–46.0)
Hemoglobin: 13.1 g/dL (ref 12.0–15.0)
MCH: 26.9 pg (ref 26.0–34.0)
MCHC: 32.8 g/dL (ref 30.0–36.0)
MCV: 81.9 fL (ref 78.0–100.0)
PLATELETS: 213 10*3/uL (ref 150–400)
RBC: 4.87 MIL/uL (ref 3.87–5.11)
RDW: 14.2 % (ref 11.5–15.5)
WBC: 6.5 10*3/uL (ref 4.0–10.5)

## 2013-06-17 LAB — I-STAT TROPONIN, ED: TROPONIN I, POC: 0.01 ng/mL (ref 0.00–0.08)

## 2013-06-17 LAB — BASIC METABOLIC PANEL
BUN: 11 mg/dL (ref 6–23)
CALCIUM: 9.6 mg/dL (ref 8.4–10.5)
CO2: 22 mEq/L (ref 19–32)
CREATININE: 0.56 mg/dL (ref 0.50–1.10)
Chloride: 103 mEq/L (ref 96–112)
Glucose, Bld: 89 mg/dL (ref 70–99)
Potassium: 4.1 mEq/L (ref 3.7–5.3)
SODIUM: 138 meq/L (ref 137–147)

## 2013-06-17 LAB — D-DIMER, QUANTITATIVE: D-Dimer, Quant: 0.55 ug/mL-FEU — ABNORMAL HIGH (ref 0.00–0.48)

## 2013-06-17 MED ORDER — IBUPROFEN 800 MG PO TABS: 800.0000 mg | ORAL_TABLET | Freq: Three times a day (TID) | ORAL | Status: DC

## 2013-06-17 MED ORDER — IOHEXOL 350 MG/ML SOLN
75.0000 mL | Freq: Once | INTRAVENOUS | Status: AC | PRN
  Administered 2013-06-17: 75 mL via INTRAVENOUS

## 2013-06-17 MED ORDER — KETOROLAC TROMETHAMINE 60 MG/2ML IM SOLN
60.0000 mg | Freq: Once | INTRAMUSCULAR | Status: AC
  Administered 2013-06-17: 60 mg via INTRAMUSCULAR
  Filled 2013-06-17: qty 2

## 2013-06-17 NOTE — ED Notes (Signed)
Antony Haste RN from PEDS called to assist with IV placement

## 2013-06-17 NOTE — ED Notes (Signed)
EDPA Robin at bedside.

## 2013-06-17 NOTE — ED Provider Notes (Signed)
CSN: [id number]     Arrival date & time 06/17/13  0725 History   First MD Initiated Contact with Patient 06/17/13 [phone]     Chief Complaint  Patient presents with  . Chest Pain     (Consider location/radiation/quality/duration/timing/severity/associated sxs/prior Treatment) HPI Comments: Patient is a 53 year old female with past medical history of asthma and diverticulitis who presents to the emergency department complaining of left-sided chest pain radiating towards her left upper back beginning 2 hours prior to arrival. 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 Pain described as a "muscle pain", constant, worse with deep inspiration and movement of her left arm. She cannot recall lifting anything or straining her muscles in any way. States she did drink caffeine this morning which she only does on occasion and took an aspirin prior to arrival with no relief of her pain. Admits to occasional shortness of breath when she moves. Denies nausea, vomiting or diaphoresis. Denies fever or cough. No calf pain or swelling. Admits to family history of early heart disease. Her mom passed away at the age of 66. Nonsmoker. Denies hx of heart disease. No hx of blood clots.  Patient is a 53 y.o. female presenting with chest pain. The history is provided by the patient.  Chest Pain Associated symptoms: shortness of breath     Past Medical History  Diagnosis Date  . Diverticulitis   . Asthma    History reviewed. No pertinent past surgical history. History reviewed. No pertinent family history. History  Substance Use Topics  . Smoking status: Never Smoker   . Smokeless tobacco: Not on file  . Alcohol Use: No   OB History   Grav Para Term Preterm Abortions TAB SAB Ect Mult Living                 Review of Systems  Respiratory: Positive for shortness of breath.   Cardiovascular: Positive for chest pain.  All other systems reviewed and are  negative.     Allergies  Ciprofloxacin  Home Medications   Prior to Admission medications   Medication Sig Start Date End Date Taking? Authorizing Provider  ADVAIR DISKUS 100-50 MCG/DOSE AEPB Inhale 1 puff into the lungs daily.  11/24/12   Historical Provider, MD  Beclomethasone Dipropionate (QVAR IN) Inhale 1 puff into the lungs 2 (two) times daily.    Historical Provider, MD  Calcium Carbonate (CALCIUM 600 PO) Take 600 mg by mouth 3 (three) times daily.    Historical Provider, MD  COENZYME Q-10 PO Take 10 mLs by mouth daily.    Historical Provider, MD  HYDROcodone-acetaminophen (NORCO/VICODIN) 5-325 MG per tablet Take 1-2 tablets by mouth every 6 (six) hours as needed. 04/18/13   Antonietta Breach, PA-C  ibuprofen (ADVIL,MOTRIN) 600 MG tablet Take 1 tablet (600 mg total) by mouth every 6 (six) hours as needed. 04/18/13   Antonietta Breach, PA-C  metroNIDAZOLE (FLAGYL) 500 MG tablet Take 1 tablet (500 mg total) by mouth 2 (two) times daily. 04/18/13   Ruthell Rummage, MD  Multiple Vitamin (MULTIVITAMIN WITH MINERALS) TABS tablet Take 1 tablet by mouth daily.    Historical Provider, MD  Multiple Vitamins-Minerals (HAIR/SKIN/NAILS) TABS Take 3 tablets by mouth daily.    Historical Provider, MD   There were no vitals taken for this visit. Physical Exam  Nursing note and vitals reviewed. Constitutional: She is oriented to person, place, and time. She appears well-developed and well-nourished. No distress.  Obese.  HENT:  Head: Normocephalic and atraumatic.  Mouth/Throat: Oropharynx is clear and moist.  Eyes: Conjunctivae and EOM are normal. Pupils are equal, round, and reactive to light.  Neck: Normal range of motion. Neck supple. No JVD present.  Cardiovascular: Normal rate, regular rhythm, normal heart sounds and intact distal pulses.   No extremity edema.  Pulmonary/Chest: Effort normal and breath sounds normal. No respiratory distress. She exhibits tenderness.  Tenderness over left lateral breast,  pt states different pain than she is experiencing.  Abdominal: Soft. Bowel sounds are normal. There is no tenderness.  Musculoskeletal: Normal range of motion. She exhibits no edema.  Upper back pain not reproducible.  Neurological: She is alert and oriented to person, place, and time. She has normal strength. No sensory deficit.  Speech fluent, goal oriented. Moves limbs without ataxia. Equal grip strength bilateral.  Skin: Skin is warm and dry. She is not diaphoretic.  Psychiatric: She has a normal mood and affect. Her behavior is normal.    ED Course  Procedures (including critical care time) Labs Review Labs Reviewed  [place], ED    Imaging Review No results found.   EKG Interpretation None      MDM   Final diagnoses:  Chest pain  Chest wall pain   Patient presenting with chest pain, pleuritic. I am unable to reproduce her chest pain. She is well appearing and in no apparent distress. Vital signs stable. No significant past medical history. Positive family history of early heart disease. Labs, CXR pending. EKG without any acute findings. 8:59 AM D-dimer elevated slightly at 5.5. Cannot PERC out negative. Will obtain CT angio. 11:41 AM CT angio negative for PE. Pt reports her pain is still present, only when she moves. Doubt cardiac, low risk HEART score 2. Stable for d/c with PCP f/u. Return precautions given. Patient states understanding of treatment care plan and is agreeable.  Case discussed with attending Dr. Vanita Panda who agrees with plan of care.   Illene Labrador, PA-C 06/17/13 1142

## 2013-06-17 NOTE — ED Provider Notes (Signed)
  This was a shared visit with a mid-level provided (NP or PA).  Throughout the patient's course I was available for consultation/collaboration.  I saw the ECG (if appropriate), relevant labs and studies - I agree with the interpretation.  On my exam the patient was in no distress.  However, with her elevated D. dimer, CT scan was performed.  This was reassuring, and the patient was discharged in stable condition to follow up with primary care.      Carmin Muskrat, MD 06/17/13 [phone]

## 2013-06-17 NOTE — ED Notes (Signed)
Pt complains of chest and back pain on left side, onset this am, sts she did drink caffeine and took asa pta, pt sts pain worse with movement of arms, admitted for obs in past for palpitations

## 2013-06-17 NOTE — ED Notes (Signed)
Patient given 2 work notes for herself and husband. Patient ambulatory, in no acute distress. Acknowledges discharge and follow-up instructions.

## 2013-06-17 NOTE — ED Notes (Signed)
Patient transported to X-ray 

## 2013-06-17 NOTE — Discharge Instructions (Signed)
Chest Pain (Nonspecific) °It is often hard to give a specific diagnosis for the cause of chest pain. There is always a chance that your pain could be related to something serious, such as a heart attack or a blood clot in the lungs. You need to follow up with your caregiver for further evaluation. °CAUSES  °· Heartburn. °· Pneumonia or bronchitis. °· Anxiety or stress. °· Inflammation around your heart (pericarditis) or lung (pleuritis or pleurisy). °· A blood clot in the lung. °· A collapsed lung (pneumothorax). It can develop suddenly on its own (spontaneous pneumothorax) or from injury (trauma) to the chest. °· Shingles infection (herpes zoster virus). °The chest wall is composed of bones, muscles, and cartilage. Any of these can be the source of the pain. °· The bones can be bruised by injury. °· The muscles or cartilage can be strained by coughing or overwork. °· The cartilage can be affected by inflammation and become sore (costochondritis). °DIAGNOSIS  °Lab tests or other studies, such as X-rays, electrocardiography, stress testing, or cardiac imaging, may be needed to find the cause of your pain.  °TREATMENT  °· Treatment depends on what may be causing your chest pain. Treatment may include: °· Acid blockers for heartburn. °· Anti-inflammatory medicine. °· Pain medicine for inflammatory conditions. °· Antibiotics if an infection is present. °· You may be advised to change lifestyle habits. This includes stopping smoking and avoiding alcohol, caffeine, and chocolate. °· You may be advised to keep your head raised (elevated) when sleeping. This reduces the chance of acid going backward from your stomach into your esophagus. °· Most of the time, nonspecific chest pain will improve within 2 to 3 days with rest and mild pain medicine. °HOME CARE INSTRUCTIONS  °· If antibiotics were prescribed, take your antibiotics as directed. Finish them even if you start to feel better. °· For the next few days, avoid physical  activities that bring on chest pain. Continue physical activities as directed. °· Do not smoke. °· Avoid drinking alcohol. °· Only take over-the-counter or prescription medicine for pain, discomfort, or fever as directed by your caregiver. °· Follow your caregiver's suggestions for further testing if your chest pain does not go away. °· Keep any follow-up appointments you made. If you do not go to an appointment, you could develop lasting (chronic) problems with pain. If there is any problem keeping an appointment, you must call to reschedule. °SEEK MEDICAL CARE IF:  °· You think you are having problems from the medicine you are taking. Read your medicine instructions carefully. °· Your chest pain does not go away, even after treatment. °· You develop a rash with blisters on your chest. °SEEK IMMEDIATE MEDICAL CARE IF:  °· You have increased chest pain or pain that spreads to your arm, neck, jaw, back, or abdomen. °· You develop shortness of breath, an increasing cough, or you are coughing up blood. °· You have severe back or abdominal pain, feel nauseous, or vomit. °· You develop severe weakness, fainting, or chills. °· You have a fever. °THIS IS AN EMERGENCY. Do not wait to see if the pain will go away. Get medical help at once. Call your local emergency services (911 in [place] Do not drive yourself to the hospital. °MAKE SURE YOU:  °· Understand these instructions. °· Will watch your condition. °· Will get help right away if you are not doing well or get worse. °Document Released: 11/20/2004 Document Revised: 05/05/2011 Document Reviewed: 09/16/2007 °ExitCare® Patient Information ©2014 [employer]   LL[employer].  Chest Wall Pain Chest wall pain is pain in or around the bones and muscles of your chest. It may take up to 6 weeks to get better. It may take longer if you must stay physically active in your work and activities.  CAUSES  Chest wall pain may happen on its own. However, it may be caused by:  A viral illness  like the flu.  Injury.  Coughing.  Exercise.  Arthritis.  Fibromyalgia.  Shingles. HOME CARE INSTRUCTIONS   Avoid overtiring physical activity. Try not to strain or perform activities that cause pain. This includes any activities using your chest or your abdominal and side muscles, especially if heavy weights are used.  Put ice on the sore area.  Put ice in a plastic bag.  Place a towel between your skin and the bag.  Leave the ice on for 15-20 minutes per hour while awake for the first 2 days.  Only take over-the-counter or prescription medicines for pain, discomfort, or fever as directed by your caregiver. SEEK IMMEDIATE MEDICAL CARE IF:   Your pain increases, or you are very uncomfortable.  You have a fever.  Your chest pain becomes worse.  You have new, unexplained symptoms.  You have nausea or vomiting.  You feel sweaty or lightheaded.  You have a cough with phlegm (sputum), or you cough up blood. MAKE SURE YOU:   Understand these instructions.  Will watch your condition.  Will get help right away if you are not doing well or get worse. Document Released: 02/10/2005 Document Revised: 05/05/2011 Document Reviewed: 10/07/2010 [facility] Patient Information 2014 [place], [place].

## 2013-06-17 NOTE — ED Notes (Addendum)
Pt presents with sudden onset of Left side chest pain radiating to her Left back, pt also reports Left shoulder shoulder tension. Pt states her symptoms started at 0600 this am after drinking a cup of coffee, pt states she drank the coffee quickly and usually does not drink coffee. Pt describes her pain as a spsam pain that worsens with movement or deep breathing. Pt reports a strong maternal family cardiac history, both mother passed away with CAD and sister had a MI at 26 years old.  Pt denies SOB, diaphoresis, nausea, vomiting, abd pain, weakness, and dizziness.

## 2013-08-19 ENCOUNTER — Other Ambulatory Visit (HOSPITAL_COMMUNITY)
Admission: RE | Admit: 2013-08-19 | Discharge: 2013-08-19 | Disposition: A | Discharge status: Home / Self Care | Payer: BC Managed Care – PPO | Source: Ambulatory Visit | Attending: Family Medicine | Admitting: Family Medicine

## 2013-08-19 ENCOUNTER — Other Ambulatory Visit: Payer: Self-pay | Admitting: Physician Assistant

## 2013-08-19 DIAGNOSIS — Z124 Encounter for screening for malignant neoplasm of cervix: Secondary | ICD-10-CM | POA: Insufficient documentation

## 2013-08-22 LAB — CYTOLOGY - PAP

## 2013-09-22 ENCOUNTER — Emergency Department (HOSPITAL_COMMUNITY)
Admission: EM | Admit: 2013-09-22 | Discharge: 2013-09-22 | Disposition: A | Discharge status: Home / Self Care | Payer: BC Managed Care – PPO | Attending: Emergency Medicine | Admitting: Emergency Medicine

## 2013-09-22 ENCOUNTER — Encounter (HOSPITAL_COMMUNITY): Payer: Self-pay | Admitting: Emergency Medicine

## 2013-09-22 DIAGNOSIS — IMO0002 Reserved for concepts with insufficient information to code with codable children: Secondary | ICD-10-CM | POA: Insufficient documentation

## 2013-09-22 DIAGNOSIS — Z87891 Personal history of nicotine dependence: Secondary | ICD-10-CM | POA: Insufficient documentation

## 2013-09-22 DIAGNOSIS — Z8719 Personal history of other diseases of the digestive system: Secondary | ICD-10-CM | POA: Insufficient documentation

## 2013-09-22 DIAGNOSIS — G5600 Carpal tunnel syndrome, unspecified upper limb: Secondary | ICD-10-CM | POA: Insufficient documentation

## 2013-09-22 DIAGNOSIS — Z79899 Other long term (current) drug therapy: Secondary | ICD-10-CM | POA: Insufficient documentation

## 2013-09-22 DIAGNOSIS — G5601 Carpal tunnel syndrome, right upper limb: Secondary | ICD-10-CM

## 2013-09-22 DIAGNOSIS — J45909 Unspecified asthma, uncomplicated: Secondary | ICD-10-CM | POA: Insufficient documentation

## 2013-09-22 DIAGNOSIS — M79609 Pain in unspecified limb: Secondary | ICD-10-CM | POA: Insufficient documentation

## 2013-09-22 MED ORDER — KETOROLAC TROMETHAMINE 30 MG/ML IJ SOLN
30.0000 mg | Freq: Once | INTRAMUSCULAR | Status: AC
  Administered 2013-09-22: 30 mg via INTRAMUSCULAR
  Filled 2013-09-22: qty 1

## 2013-09-22 MED ORDER — KETOROLAC TROMETHAMINE 30 MG/ML IJ SOLN: 30.0000 mg | Freq: Once | INTRAMUSCULAR | Status: DC

## 2013-09-22 MED ORDER — HYDROCODONE-ACETAMINOPHEN 5-325 MG PO TABS
ORAL_TABLET | ORAL | Status: DC
Start: 2013-09-22 — End: 2015-04-20

## 2013-09-22 MED ORDER — HYDROCODONE-ACETAMINOPHEN 5-325 MG PO TABS
2.0000 | ORAL_TABLET | Freq: Once | ORAL | Status: AC
  Administered 2013-09-22: 2 via ORAL
  Filled 2013-09-22: qty 2

## 2013-09-22 NOTE — ED Notes (Signed)
C/O right hand pain that began 3 weeks ago worsening last night.  Kept her up all night.  Rates pain 9/10.  Also c/o pressure left side of face since being exposed to grass all weekend long (has allergy to it).

## 2013-09-22 NOTE — ED Notes (Signed)
Puffiness noted to right hand.  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  Has been experiencing increasing pain to her hand over past three weeks but last night became so painful that she did not sleep well.  Husband had to help her get ready for work as she was unable to use right hand this morning.

## 2013-09-22 NOTE — Discharge Instructions (Signed)
Take vicodin for breakthrough pain, do not drink alcohol, drive, care for children or do other critical tasks while taking vicodin.  Please follow with your primary care doctor in the next 2 days for a check-up. They must obtain records for further management.   Do not hesitate to return to the Emergency Department for any new, worsening or concerning symptoms.    Carpal Tunnel Syndrome The carpal tunnel is a narrow area located on the palm side of your wrist. The tunnel is formed by the wrist bones and ligaments. Nerves, blood vessels, and tendons pass through the carpal tunnel. Repeated wrist motion or certain diseases may cause swelling within the tunnel. This swelling pinches the main nerve in the wrist (median nerve) and causes the painful hand and arm condition called carpal tunnel syndrome. CAUSES   Repeated wrist motions.  Wrist injuries.  Certain diseases like arthritis, diabetes, alcoholism, hyperthyroidism, and kidney failure.  Obesity.  Pregnancy. SYMPTOMS   A "pins and needles" feeling in your fingers or hand, especially in your thumb, index and middle fingers.  Tingling or numbness in your fingers or hand.  An aching feeling in your entire arm, especially when your wrist and elbow are bent for long periods of time.  Wrist pain that goes up your arm to your shoulder.  Pain that goes down into your palm or fingers.  A weak feeling in your hands. DIAGNOSIS  Your health care provider will take your history and perform a physical exam. An electromyography test may be needed. This test measures electrical signals sent out by your nerves into the muscles. The electrical signals are usually slowed by carpal tunnel syndrome. You may also need X-rays. TREATMENT  Carpal tunnel syndrome may clear up by itself. Your health care provider may recommend a wrist splint or medicine such as a nonsteroidal anti-inflammatory medicine. Cortisone injections may help. Sometimes, surgery may  be needed to free the pinched nerve.  HOME CARE INSTRUCTIONS   Take all medicine as directed by your health care provider. Only take over-the-counter or prescription medicines for pain, discomfort, or fever as directed by your health care provider.  If you were given a splint to keep your wrist from bending, wear it as directed. It is important to wear the splint at night. Wear the splint for as long as you have pain or numbness in your hand, arm, or wrist. This may take 1 to 2 months.  Rest your wrist from any activity that may be causing your pain. If your symptoms are work-related, you may need to talk to your employer about changing to a job that does not require using your wrist.  Put ice on your wrist after long periods of wrist activity.  Put ice in a plastic bag.  Place a towel between your skin and the bag.  Leave the ice on for 15-20 minutes, 03-04 times a day.  Keep all follow-up visits as directed by your health care provider. This includes any orthopedic referrals, physical therapy, and rehabilitation. Any delay in getting necessary care could result in a delay or failure of your condition to heal. SEEK IMMEDIATE MEDICAL CARE IF:   You have new, unexplained symptoms.  Your symptoms get worse and are not helped or controlled with medicines. MAKE SURE YOU:   Understand these instructions.  Will watch your condition.  Will get help right away if you are not doing well or get worse. Document Released: 02/08/2000 Document Revised: 06/27/2013 Document Reviewed: 12/27/2010 ExitCare Patient Information  2015 [employer]. This information is not intended to replace advice given to you by your health care provider. Make sure you discuss any questions you have with your health care provider.

## 2013-09-22 NOTE — ED Provider Notes (Signed)
CSN: [id number]     Arrival date & time 09/22/13  0608 History   None    Chief Complaint  Patient presents with  . Hand Pain     (Consider location/radiation/quality/duration/timing/severity/associated sxs/prior Treatment) HPI  Lauren Nicholson is a 53 y.o. female complaining of complaining of severe right handed pain worsening over the course of 24 hours, this started approximately 3 months ago. Patient is right-hand dominant. 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 Patient has been wearing a brace with little relief. She states that there is a numbness and tingling to the radial nerve distribution. Patient prior similar episodes but never this severe. No pain medications prior to arrival.   Past Medical History  Diagnosis Date  . Diverticulitis   . Asthma    History reviewed. No pertinent past surgical history. History reviewed. No pertinent family history. History  Substance Use Topics  . Smoking status: Former Smoker    Quit date: 09/23/1998  . Smokeless tobacco: Not on file  . Alcohol Use: No   OB History   Grav Para Term Preterm Abortions TAB SAB Ect Mult Living                 Review of Systems  10 systems reviewed and found to be negative, except as noted in the HPI.  Allergies  Ciprofloxacin  Home Medications   Prior to Admission medications   Medication Sig Start Date End Date Taking? Authorizing Provider  albuterol (PROVENTIL HFA;VENTOLIN HFA) 108 (90 BASE) MCG/ACT inhaler Inhale 1-2 puffs into the lungs every 6 (six) hours as needed for wheezing or shortness of breath.   Yes Historical Provider, MD  cetirizine (ZYRTEC) 10 MG tablet Take 10 mg by mouth daily.   Yes Historical Provider, MD  Fluticasone-Salmeterol (ADVAIR) 100-50 MCG/DOSE AEPB Inhale 1 puff into the lungs 2 (two) times daily.   Yes Historical Provider, MD  albuterol (PROVENTIL) (2.5 MG/3ML) 0.083% nebulizer solution Take 2.5 mg by  nebulization every 6 (six) hours as needed for wheezing or shortness of breath.    Historical Provider, MD   BP 131/77  Pulse 102  Temp(Src) 97.9 F (36.6 C) (Oral)  Resp 16  Ht 4\' 11"  (1.499 m)  Wt 186 lb (84.369 kg)  BMI 37.55 kg/m2  SpO2 98% Physical Exam  Nursing note and vitals reviewed. Constitutional: She is oriented to person, place, and time. She appears well-developed and well-nourished. No distress.  HENT:  Head: Normocephalic and atraumatic.  Mouth/Throat: Oropharynx is clear and moist.  Eyes: Conjunctivae and EOM are normal. Pupils are equal, round, and reactive to light.  Neck: Normal range of motion.  Cardiovascular: Normal rate, regular rhythm and intact distal pulses.   Pulmonary/Chest: Effort normal and breath sounds normal. No stridor. No respiratory distress. She has no wheezes. She has no rales. She exhibits no tenderness.  Abdominal: Soft. Bowel sounds are normal. She exhibits no distension and no mass. There is no tenderness. There is no rebound and no guarding.  Musculoskeletal: Normal range of motion.  There is very slight swelling to the right hand. She is neurovascularly intact. Patient has limited range of motion in the wrist. Patient has positive Phalen and Tinel test on the right side.  Neurological: She is alert and oriented to person, place, and time.  Psychiatric: She has a normal mood and affect.    ED Course  Procedures (including critical care time) Labs Review Labs Reviewed -  No data to display  Imaging Review No results found.   EKG Interpretation None      MDM   Final diagnoses:  None    Filed Vitals:   09/22/13 0624 09/22/13 0700 09/22/13 0756  BP: 131/77 137/82 128/76  Pulse: 102 87   Temp: 97.9 F (36.6 C)  98 F (36.7 C)  TempSrc: Oral  Oral  Resp: 16    Height: 4\' 11"  (1.499 m)    Weight: 186 lb (84.369 kg)    SpO2: 98% 98% 98%    Medications  HYDROcodone-acetaminophen (NORCO/VICODIN) 5-325 MG per tablet 2  tablet (2 tablets Oral Given 09/22/13 0724)  ketorolac (TORADOL) 30 MG/ML injection 30 mg (30 mg Intramuscular Given 09/22/13 0724)    TUJUANA Nicholson is a 53 y.o. female presenting with Right hand pain, repetitive use syndrome. Physical exam with positive Phalen and Tinnel test.patient has been using wrist splint. Encourage her to use high-dose NSAIDs to reduce inflammation. I'm also going to write her for Norco for pain control. I have asked her to check in with her primary care physician and also given her a referral to hand surgeon Dr. Burney Gauze.   Evaluation does not show pathology that would require ongoing emergent intervention or inpatient treatment. Pt is hemodynamically stable and mentating appropriately. Discussed findings and plan with patient/guardian, who agrees with care plan. All questions answered. Return precautions discussed and outpatient follow up given.   Discharge Medication List as of 09/22/2013  7:26 AM    START taking these medications   Details  HYDROcodone-acetaminophen (NORCO/VICODIN) 5-325 MG per tablet Take 1-2 tablets by mouth every 6 hours as needed for pain., Print             Monico Blitz, PA-C 09/22/13 1650

## 2013-09-23 NOTE — ED Provider Notes (Signed)
Medical screening examination/treatment/procedure(s) were performed by non-physician practitioner and as supervising physician I was immediately available for consultation/collaboration.   EKG Interpretation None        Neta Ehlers, MD 09/23/13 [phone]

## 2015-01-06 ENCOUNTER — Emergency Department (HOSPITAL_COMMUNITY): Payer: BC Managed Care – PPO

## 2015-01-06 ENCOUNTER — Emergency Department (HOSPITAL_COMMUNITY)
Admission: EM | Admit: 2015-01-06 | Discharge: 2015-01-06 | Disposition: A | Discharge status: Home / Self Care | Payer: BC Managed Care – PPO | Attending: Emergency Medicine | Admitting: Emergency Medicine

## 2015-01-06 DIAGNOSIS — R21 Rash and other nonspecific skin eruption: Secondary | ICD-10-CM | POA: Diagnosis present

## 2015-01-06 DIAGNOSIS — J45901 Unspecified asthma with (acute) exacerbation: Secondary | ICD-10-CM | POA: Insufficient documentation

## 2015-01-06 DIAGNOSIS — Z87891 Personal history of nicotine dependence: Secondary | ICD-10-CM | POA: Diagnosis not present

## 2015-01-06 DIAGNOSIS — Z7951 Long term (current) use of inhaled steroids: Secondary | ICD-10-CM | POA: Insufficient documentation

## 2015-01-06 DIAGNOSIS — Z79899 Other long term (current) drug therapy: Secondary | ICD-10-CM | POA: Diagnosis not present

## 2015-01-06 DIAGNOSIS — J069 Acute upper respiratory infection, unspecified: Secondary | ICD-10-CM | POA: Diagnosis not present

## 2015-01-06 LAB — CBC WITH DIFFERENTIAL/PLATELET
Basophils Absolute: 0 10*3/uL (ref 0.0–0.1)
Basophils Relative: 0 %
Eosinophils Absolute: 0.4 10*3/uL (ref 0.0–0.7)
Eosinophils Relative: 6 %
HEMATOCRIT: 43.4 % (ref 36.0–46.0)
HEMOGLOBIN: 14.1 g/dL (ref 12.0–15.0)
LYMPHS PCT: 37 %
Lymphs Abs: 2.7 10*3/uL (ref 0.7–4.0)
MCH: 26.4 pg (ref 26.0–34.0)
MCHC: 32.5 g/dL (ref 30.0–36.0)
MCV: 81.1 fL (ref 78.0–100.0)
MONO ABS: 0.4 10*3/uL (ref 0.1–1.0)
MONOS PCT: 6 %
NEUTROS ABS: 3.8 10*3/uL (ref 1.7–7.7)
Neutrophils Relative %: 51 %
Platelets: 185 10*3/uL (ref 150–400)
RBC: 5.35 MIL/uL — ABNORMAL HIGH (ref 3.87–5.11)
RDW: 14.6 % (ref 11.5–15.5)
WBC: 7.3 10*3/uL (ref 4.0–10.5)

## 2015-01-06 LAB — BASIC METABOLIC PANEL
ANION GAP: 9 (ref 5–15)
BUN: 9 mg/dL (ref 6–20)
CO2: 23 mmol/L (ref 22–32)
Calcium: 9.6 mg/dL (ref 8.9–10.3)
Chloride: 104 mmol/L (ref 101–111)
Creatinine, Ser: 0.53 mg/dL (ref 0.44–1.00)
GFR calc Af Amer: >60 mL/min (ref >60)
GFR calc non Af Amer: >60 mL/min (ref >60)
GLUCOSE: 81 mg/dL (ref 65–99)
POTASSIUM: 4.1 mmol/L (ref 3.5–5.1)
Sodium: 136 mmol/L (ref 135–145)

## 2015-01-06 MED ORDER — ALBUTEROL SULFATE HFA 108 (90 BASE) MCG/ACT IN AERS
2.0000 | INHALATION_SPRAY | Freq: Once | RESPIRATORY_TRACT | Status: AC
  Administered 2015-01-06: 2 via RESPIRATORY_TRACT
  Filled 2015-01-06: qty 6.7

## 2015-01-06 MED ORDER — PREDNISONE 20 MG PO TABS: 40.0000 mg | ORAL_TABLET | Freq: Every day | ORAL | Status: DC

## 2015-01-06 MED ORDER — ALBUTEROL SULFATE HFA 108 (90 BASE) MCG/ACT IN AERS: 2.0000 | INHALATION_SPRAY | RESPIRATORY_TRACT | Status: AC | PRN

## 2015-01-06 MED ORDER — IPRATROPIUM BROMIDE 0.02 % IN SOLN
0.5000 mg | Freq: Once | RESPIRATORY_TRACT | Status: AC
  Administered 2015-01-06: 0.5 mg via RESPIRATORY_TRACT
  Filled 2015-01-06: qty 2.5

## 2015-01-06 MED ORDER — DIPHENHYDRAMINE HCL 25 MG PO CAPS
25.0000 mg | ORAL_CAPSULE | Freq: Once | ORAL | Status: AC
  Administered 2015-01-06: 25 mg via ORAL
  Filled 2015-01-06: qty 1

## 2015-01-06 MED ORDER — ALBUTEROL SULFATE (2.5 MG/3ML) 0.083% IN NEBU
5.0000 mg | INHALATION_SOLUTION | Freq: Once | RESPIRATORY_TRACT | Status: AC
  Administered 2015-01-06: 5 mg via RESPIRATORY_TRACT
  Filled 2015-01-06: qty 6

## 2015-01-06 MED ORDER — DIPHENHYDRAMINE HCL 25 MG PO TABS: 25.0000 mg | ORAL_TABLET | Freq: Four times a day (QID) | ORAL | Status: DC | PRN

## 2015-01-06 NOTE — ED Notes (Signed)
Pt c/o shortness of breath and cough w/ dark yellow sputum. Pt hx asthma. Some relief with albuterol inhaler. Pt also concerned about pruritic rash across chest and abdomen. Pt ambulatory w/ steady gait to restroom w/o difficulty.

## 2015-01-06 NOTE — ED Notes (Signed)
Phlebotomy at the bedside  

## 2015-01-06 NOTE — ED Notes (Signed)
Patient being transported to xray at this time 

## 2015-01-06 NOTE — Discharge Instructions (Signed)
Upper Respiratory Infection, Adult Most upper respiratory infections (URIs) are a viral infection of the air passages leading to the lungs. A URI affects the nose, throat, and upper air passages. The most common type of URI is nasopharyngitis and is typically referred to as "the common cold." URIs run their course and usually go away on their own. Most of the time, a URI does not require medical attention, but sometimes a bacterial infection in the upper airways can follow a viral infection. This is called a secondary infection. Sinus and middle ear infections are common types of secondary upper respiratory infections. Bacterial pneumonia can also complicate a URI. A URI can worsen asthma and chronic obstructive pulmonary disease (COPD). Sometimes, these complications can require emergency medical care and may be life threatening.  CAUSES Almost all URIs are caused by viruses. A virus is a type of germ and can spread from one person to another.  RISKS FACTORS You may be at risk for a URI if:   You smoke.   You have chronic heart or lung disease.  You have a weakened defense (immune) system.   You are very young or very old.   You have nasal allergies or asthma.  You work in crowded or poorly ventilated areas.  You work in health care facilities or schools. SIGNS AND SYMPTOMS  Symptoms typically develop 2-3 days after you come in contact with a cold virus. Most viral URIs last 7-10 days. However, viral URIs from the influenza virus (flu virus) can last 14-18 days and are typically more severe. Symptoms may include:   Runny or stuffy (congested) nose.   Sneezing.   Cough.   Sore throat.   Headache.   Fatigue.   Fever.   Loss of appetite.   Pain in your forehead, behind your eyes, and over your cheekbones (sinus pain).  Muscle aches.  DIAGNOSIS  Your health care provider may diagnose a URI by:  Physical exam.  Tests to check that your symptoms are not due to  another condition such as:  Strep throat.  Sinusitis.  Pneumonia.  Asthma. TREATMENT  A URI goes away on its own with time. It cannot be cured with medicines, but medicines may be prescribed or recommended to relieve symptoms. Medicines may help:  Reduce your fever.  Reduce your cough.  Relieve nasal congestion. HOME CARE INSTRUCTIONS   Take medicines only as directed by your health care provider.   Gargle warm saltwater or take cough drops to comfort your throat as directed by your health care provider.  Use a warm mist humidifier or inhale steam from a shower to increase air moisture. This may make it easier to breathe.  Drink enough fluid to keep your urine clear or pale yellow.   Eat soups and other clear broths and maintain good nutrition.   Rest as needed.   Return to work when your temperature has returned to normal or as your health care provider advises. You may need to stay home longer to avoid infecting others. You can also use a face mask and careful hand washing to prevent spread of the virus.  Increase the usage of your inhaler if you have asthma.   Do not use any tobacco products, including cigarettes, chewing tobacco, or electronic cigarettes. If you need help quitting, ask your health care provider. PREVENTION  The best way to protect yourself from getting a cold is to practice good hygiene.   Avoid oral or hand contact with people with cold  symptoms.   Wash your hands often if contact occurs.  There is no clear evidence that vitamin C, vitamin E, echinacea, or exercise reduces the chance of developing a cold. However, it is always recommended to get plenty of rest, exercise, and practice good nutrition.  SEEK MEDICAL CARE IF:   You are getting worse rather than better.   Your symptoms are not controlled by medicine.   You have chills.  You have worsening shortness of breath.  You have brown or red mucus.  You have yellow or brown nasal  discharge.  You have pain in your face, especially when you bend forward.  You have a fever.  You have swollen neck glands.  You have pain while swallowing.  You have white areas in the back of your throat. SEEK IMMEDIATE MEDICAL CARE IF:   You have severe or persistent:  Headache.  Ear pain.  Sinus pain.  Chest pain.  You have chronic lung disease and any of the following:  Wheezing.  Prolonged cough.  Coughing up blood.  A change in your usual mucus.  You have a stiff neck.  You have changes in your:  Vision.  Hearing.  Thinking.  Mood. MAKE SURE YOU:   Understand these instructions.  Will watch your condition.  Will get help right away if you are not doing well or get worse.   This information is not intended to replace advice given to you by your health care provider. Make sure you discuss any questions you have with your health care provider.   Document Released: 08/06/2000 Document Revised: 06/27/2014 Document Reviewed: 05/18/2013 Elsevier Interactive Patient Education 2016 [place] Prevention While you may not be able to control the fact that you have asthma, you can take actions to prevent asthma attacks. The best way to prevent asthma attacks is to maintain good control of your asthma. You can achieve this by:  Taking your medicines as directed.  Avoiding things that can irritate your airways or make your asthma symptoms worse (asthma triggers).  Keeping track of how well your asthma is controlled and of any changes in your symptoms.  Responding quickly to worsening asthma symptoms (asthma attack).  Seeking emergency care when it is needed. WHAT ARE SOME WAYS TO PREVENT AN ASTHMA ATTACK? Have a Plan Work with your health care provider to create a written plan for managing and treating your asthma attacks (asthma action plan). This plan includes:  A list of your asthma triggers and how you can avoid  them.  Information on when medicines should be taken and when their dosages should be changed.  The use of a device that measures how well your lungs are working (peak flow meter). Monitor Your Asthma Use your peak flow meter and record your results in a journal every day. A drop in your peak flow numbers on one or more days may indicate the start of an asthma attack. This can happen even before you start to feel symptoms. You can prevent an asthma attack from getting worse by following the steps in your asthma action plan. Avoid Asthma Triggers Work with your asthma health care provider to find out what your asthma triggers are. This can be done by:  Allergy testing.  Keeping a journal that notes when asthma attacks occur and the factors that may have contributed to them.  Determining if there are other medical conditions that are making your asthma worse. Once you have determined your asthma triggers, take steps  to avoid them. This may include avoiding excessive or prolonged exposure to:  Dust. Have someone dust and vacuum your home for you once or twice a week. Using a high-efficiency particulate arrestance (HEPA) vacuum is best.  Smoke. This includes campfire smoke, forest fire smoke, and secondhand smoke from tobacco products.  Pet dander. Avoid contact with animals that you know you are allergic to.  Allergens from trees, grasses or pollens. Avoid spending a lot of time outdoors when pollen counts are high, and on very windy days.  Very cold, dry, or humid air.  Mold.  Foods that contain high amounts of sulfites.  Strong odors.  Outdoor air pollutants, such as [employer]ic[employer].  Indoor air pollutants, such as aerosol sprays and fumes from household cleaners.  Household pests, including dust mites and cockroaches, and pest droppings.  Certain medicines, including NSAIDs. Always talk to your health care provider before stopping or starting any new medicines. Medicines Take  over-the-counter and prescription medicines only as told by your health care provider. Many asthma attacks can be prevented by carefully following your medicine schedule. Taking your medicines correctly is especially important when you cannot avoid certain asthma triggers. Act Quickly If an asthma attack does happen, acting quickly can decrease how severe it is and how long it lasts. Take these steps:   Pay attention to your symptoms. If you are coughing, wheezing, or having difficulty breathing, do not wait to see if your symptoms go away on their own. Follow your asthma action plan.  If you have followed your asthma action plan and your symptoms are not improving, call your health care provider or seek immediate medical care at the nearest hospital. It is important to note how often you need to use your fast-acting rescue inhaler. If you are using your rescue inhaler more often, it may mean that your asthma is not under control. Adjusting your asthma treatment plan may help you to prevent future asthma attacks and help you to gain better control of your condition. HOW CAN I PREVENT AN ASTHMA ATTACK WHEN I EXERCISE? Follow advice from your health care provider about whether you should use your fast-acting inhaler before exercising. Many people with asthma experience exercise-induced bronchoconstriction (EIB). This condition often worsens during vigorous exercise in cold, humid, or dry environments. Usually, people with EIB can stay very active by pre-treating with a fast-acting inhaler before exercising.   This information is not intended to replace advice given to you by your health care provider. Make sure you discuss any questions you have with your health care provider.   Document Released: 01/29/2009 Document Revised: 11/01/2014 Document Reviewed: 07/13/2014 Elsevier Interactive Patient Education 2016 [employer] Rash A rash is a change in the color or texture of the skin. There are many  different types of rashes. You may have other problems that accompany your rash. CAUSES   Infections.  Allergic reactions. This can include allergies to pets or foods.  Certain medicines.  Exposure to certain chemicals, soaps, or cosmetics.  Heat.  Exposure to poisonous plants.  Tumors, both cancerous and noncancerous. SYMPTOMS   Redness.  Scaly skin.  Itchy skin.  Dry or cracked skin.  Bumps.  Blisters.  Pain. DIAGNOSIS  Your caregiver may do a physical exam to determine what type of rash you have. A skin sample (biopsy) may be taken and examined under a microscope. TREATMENT  Treatment depends on the type of rash you have. Your caregiver may prescribe certain medicines. For serious conditions, you  may need to see a skin doctor (dermatologist). HOME CARE INSTRUCTIONS   Avoid the substance that caused your rash.  Do not scratch your rash. This can cause infection.  You may take cool baths to help stop itching.  Only take over-the-counter or prescription medicines as directed by your caregiver.  Keep all follow-up appointments as directed by your caregiver. SEEK IMMEDIATE MEDICAL CARE IF:  You have increasing pain, swelling, or redness.  You have a fever.  You have new or severe symptoms.  You have body aches, diarrhea, or vomiting.  Your rash is not better after 3 days. MAKE SURE YOU:  Understand these instructions.  Will watch your condition.  Will get help right away if you are not doing well or get worse.   This information is not intended to replace advice given to you by your health care provider. Make sure you discuss any questions you have with your health care provider.   Document Released: 01/31/2002 Document Revised: 03/03/2014 Document Reviewed: 06/28/2014 Elsevier Interactive Patient Education [employer].

## 2015-01-06 NOTE — ED Notes (Signed)
Patient d/c'd from monitor, continuous pulse oximetry and blood pressure cuff; patient getting dressed to be discharged home; visitor at bedside 

## 2015-01-06 NOTE — ED Provider Notes (Signed)
CSN: [id number]     Arrival date & time 01/06/15  [id number] History   First MD Initiated Contact with Patient 01/06/15 [phone]     Chief Complaint  Patient presents with  . Rash  . Asthma   Lauren Nicholson is a 54 y.o. female with a history of asthma and is a former smoker who presents to the emergency department complaining of productive cough, wheezing and shortness of breath for the past 5-6 days. Patient also complains of an itchy rash to her anterior chest that is been ongoing for the past 7-8 days. Patient reports she has an albuterol machine at home and last use this yesterday with some relief. She reports she's been using her Advair inhaler as prescribed. She also reports runny nose and nasal congestion. She reports she has taken nothing for treatment of her rash today. The patient denies fevers, chest pain, palpitations, hemoptysis, abdominal pain, nausea, vomiting, diarrhea, lightheadedness, dizziness, or urinary symptoms.  Patient denies changes to her soaps, lotions, perfumes, detergents or new plants or animals.  (Consider location/radiation/quality/duration/timing/severity/associated sxs/prior Treatment) HPI  Past Medical History  Diagnosis Date  . Diverticulitis   . Asthma    No past surgical history on file. No family history on file. Social History  Substance Use Topics  . Smoking status: Former Smoker    Quit date: 09/23/1998  . Smokeless tobacco: Not on file  . Alcohol Use: No   OB History    No data available     Review of Systems  Constitutional: Negative for fever and chills.  HENT: Positive for congestion, postnasal drip and rhinorrhea. Negative for ear pain, sore throat and trouble swallowing.   Eyes: Negative for visual disturbance.  Respiratory: Positive for cough, shortness of breath and wheezing. Negative for chest tightness.   Cardiovascular: Negative for chest pain, palpitations and leg swelling.  Gastrointestinal: Negative for nausea, vomiting, abdominal  pain and diarrhea.  Genitourinary: Negative for dysuria and difficulty urinating.  Musculoskeletal: Negative for back pain and neck pain.  Skin: Positive for rash. Negative for wound.  Neurological: Negative for syncope, weakness, light-headedness and headaches.      Allergies  Ciprofloxacin  Home Medications   Prior to Admission medications   Medication Sig Start Date End Date Taking? Authorizing Provider  albuterol (PROVENTIL HFA;VENTOLIN HFA) 108 (90 BASE) MCG/ACT inhaler Inhale 1-2 puffs into the lungs every 6 (six) hours as needed for wheezing or shortness of breath.   Yes Historical Provider, MD  albuterol (PROVENTIL) (2.5 MG/3ML) 0.083% nebulizer solution Take 2.5 mg by nebulization every 6 (six) hours as needed for wheezing or shortness of breath.   Yes Historical Provider, MD  cetirizine (ZYRTEC) 10 MG tablet Take 10 mg by mouth as needed for allergies.    Yes Historical Provider, MD  Fluticasone-Salmeterol (ADVAIR) 100-50 MCG/DOSE AEPB Inhale 1 puff into the lungs 2 (two) times daily.   Yes Historical Provider, MD  montelukast (SINGULAIR) 10 MG tablet Take 10 mg by mouth at bedtime.   Yes Historical Provider, MD  albuterol (PROVENTIL HFA;VENTOLIN HFA) 108 (90 BASE) MCG/ACT inhaler Inhale 2 puffs into the lungs every 4 (four) hours as needed for wheezing or shortness of breath. 01/06/15   Waynetta Pean, PA-C  diphenhydrAMINE (BENADRYL) 25 MG tablet Take 1 tablet (25 mg total) by mouth every 6 (six) hours as needed for itching (Rash). 01/06/15   Waynetta Pean, PA-C  HYDROcodone-acetaminophen (NORCO/VICODIN) 5-325 MG per tablet Take 1-2 tablets by mouth every 6 hours as needed for  pain. 09/22/13   Elmyra Ricks Pisciotta, PA-C  predniSONE (DELTASONE) 20 MG tablet Take 2 tablets (40 mg total) by mouth daily. 01/06/15   Waynetta Pean, PA-C   BP 116/83 mmHg  Pulse 80  Temp(Src) 97.4 F (36.3 C) (Oral)  Resp 18  SpO2 100% Physical Exam  Constitutional: She is oriented to person, place,  and time. She appears well-developed and well-nourished. No distress.  Nontoxic appearing.  HENT:  Head: Normocephalic and atraumatic.  Right Ear: External ear normal.  Left Ear: External ear normal.  Mouth/Throat: Oropharynx is clear and moist. No oropharyngeal exudate.  Bilateral tympanic membranes are pearly-gray without erythema or loss of landmarks.   Eyes: Conjunctivae are normal. Pupils are equal, round, and reactive to light. Right eye exhibits no discharge. Left eye exhibits no discharge.  Neck: Normal range of motion. Neck supple. No JVD present. No tracheal deviation present.  Cardiovascular: Normal rate, regular rhythm, normal heart sounds and intact distal pulses.  Exam reveals no gallop and no friction rub.   No murmur heard. Pulmonary/Chest: Effort normal. No respiratory distress. She has wheezes. She has no rales. She exhibits no tenderness.  Patient has bilateral wheezing noted; mostly expiratory wheezing. No respiratory distress. No increased work of breathing. No rales or rhonchi noted. Oxygen saturation is 97% on room air.  Abdominal: Soft. She exhibits no distension. There is no tenderness. There is no guarding.  Musculoskeletal: She exhibits no edema or tenderness.  No lower extremity edema or tenderness.  Lymphadenopathy:    She has no cervical adenopathy.  Neurological: She is alert and oriented to person, place, and time. Coordination normal.  Skin: Skin is warm and dry. No rash noted. She is not diaphoretic. There is erythema. No pallor.  Erythematous macular rash to her anterior chest wall.  Psychiatric: She has a normal mood and affect. Her behavior is normal.  Nursing note and vitals reviewed.   ED Course  Procedures (including critical care time) Labs Review Labs Reviewed  CBC WITH DIFFERENTIAL/PLATELET - Abnormal; Notable for the following:    RBC 5.35 (*)    All other components within normal limits  BASIC METABOLIC PANEL    Imaging Review Dg Chest  2 View  01/06/2015  CLINICAL DATA:  Per patient she has gotten a rash on her chest that she noticed last weekend. HX Asthma, Former smoker EXAM: CHEST  2 VIEW COMPARISON:  06/17/2013 FINDINGS: The heart size and mediastinal contours are within normal limits. Both lungs are clear. No pleural effusion or pneumothorax. Bony thorax is intact. IMPRESSION: No active cardiopulmonary disease. Electronically Signed   By: Lajean Manes M.D.   On: 01/06/2015 11:07   I have personally reviewed and evaluated these images and lab results as part of my medical decision-making.   EKG Interpretation   Date/Time:  Saturday January 06 2015 09:28:47 EST Ventricular Rate:  92 PR Interval:  165 QRS Duration: 85 QT Interval:  366 QTC Calculation: 453 R Axis:   -25 Text Interpretation:  Sinus rhythm Borderline left axis deviation Low  voltage, precordial leads Abnormal R-wave progression, early transition No  significant change since last tracing Confirmed by LITTLE MD, RACHEL  ([id number]) on 01/06/2015 11:53:36 AM      Filed Vitals:   01/06/15 1135 01/06/15 1145 01/06/15 1230 01/06/15 1323  BP: 129/88 142/93 100/69 116/83  Pulse: 88 79 91 80  Temp:      TempSrc:      Resp: 16 32 30 18  SpO2: 98% 96% 94%  100%     MDM   Meds given in ED:  Medications  diphenhydrAMINE (BENADRYL) capsule 25 mg (25 mg Oral Given 01/06/15 1021)  albuterol (PROVENTIL) (2.5 MG/3ML) 0.083% nebulizer solution 5 mg (5 mg Nebulization Given 01/06/15 1028)  ipratropium (ATROVENT) nebulizer solution 0.5 mg (0.5 mg Nebulization Given 01/06/15 1028)  albuterol (PROVENTIL HFA;VENTOLIN HFA) 108 (90 BASE) MCG/ACT inhaler 2 puff (2 puffs Inhalation Given 01/06/15 1331)    Discharge Medication List as of 01/06/2015  1:03 PM    START taking these medications   Details  !! albuterol (PROVENTIL HFA;VENTOLIN HFA) 108 (90 BASE) MCG/ACT inhaler Inhale 2 puffs into the lungs every 4 (four) hours as needed for wheezing or shortness of  breath., Starting 01/06/2015, Until Discontinued, Print    diphenhydrAMINE (BENADRYL) 25 MG tablet Take 1 tablet (25 mg total) by mouth every 6 (six) hours as needed for itching (Rash)., Starting 01/06/2015, Until Discontinued, Print    predniSONE (DELTASONE) 20 MG tablet Take 2 tablets (40 mg total) by mouth daily., Starting 01/06/2015, Until Discontinued, Print     !! - Potential duplicate medications found. Please discuss with provider.      Final diagnoses:  URI (upper respiratory infection)  Rash and nonspecific skin eruption   This is a 54 y.o. female with a history of asthma and is a former smoker who presents to the emergency department complaining of productive cough, wheezing and shortness of breath for the past 5-6 days. Patient also complains of an itchy rash to her anterior chest that is been ongoing for the past 7-8 days. Patient reports she has an albuterol machine at home and last use this yesterday with some relief. She reports she's been using her Advair inhaler as prescribed. She also reports runny nose and nasal congestion.  On exam the patient is afebrile nontoxic appearing. Patient has mild bilateral wheezes noted mostly expiratory wheezes noted. Her oxygen saturation is 97% on room air. She is not tachypneic or tachycardic. She does have an pruritic erythematous macular rash to her chest. No other rashes noted. We'll provide with albuterol, Atrovent and Benadryl and reevaluate. Will check chest x-ray and basic blood work. BMP and CBC are unremarkable. Chest x-ray shows no active cardiopulmonary disease.  Reevaluation the patient reports feeling much better after breathing treatment. She reports she no longer feels short of breath. She reports her itching from her chest has resolved. Will discharge with albuterol inhaler and prescription for refill. Will also provide prescription for short course of prednisone and Benadryl.  I advised strict return precautions and encouraged  close follow-up by primary care. I advised the patient to follow-up with their primary care provider this week. I advised the patient to return to the emergency department with new or worsening symptoms or new concerns. The patient verbalized understanding and agreement with plan.      Waynetta Pean, PA-C 01/06/15 [place], MD 01/07/15 [phone]

## 2015-04-20 ENCOUNTER — Ambulatory Visit (INDEPENDENT_AMBULATORY_CARE_PROVIDER_SITE_OTHER): Payer: BC Managed Care – PPO | Admitting: Family Medicine

## 2015-04-20 VITALS — BP 104/60 | HR 116 | Temp 100.2°F | Resp 20 | Ht 61.0 in | Wt 192.0 lb

## 2015-04-20 DIAGNOSIS — R05 Cough: Secondary | ICD-10-CM

## 2015-04-20 DIAGNOSIS — R062 Wheezing: Secondary | ICD-10-CM

## 2015-04-20 DIAGNOSIS — R059 Cough, unspecified: Secondary | ICD-10-CM

## 2015-04-20 MED ORDER — PREDNISONE 20 MG PO TABS: 40.0000 mg | ORAL_TABLET | Freq: Every day | ORAL | Status: DC

## 2015-04-20 MED ORDER — HYDROCOD POLST-CPM POLST ER 10-8 MG/5ML PO SUER: 5.0000 mL | Freq: Every evening | ORAL | Status: DC | PRN

## 2015-04-20 MED ORDER — ALBUTEROL SULFATE (2.5 MG/3ML) 0.083% IN NEBU
2.5000 mg | INHALATION_SOLUTION | Freq: Once | RESPIRATORY_TRACT | Status: AC
  Administered 2015-04-20: 2.5 mg via RESPIRATORY_TRACT

## 2015-04-20 MED ORDER — IPRATROPIUM BROMIDE 0.02 % IN SOLN
0.5000 mg | Freq: Once | RESPIRATORY_TRACT | Status: AC
  Administered 2015-04-20: 0.5 mg via RESPIRATORY_TRACT

## 2015-04-20 NOTE — Progress Notes (Signed)
Subjective:    Patient ID: Lauren Nicholson, female    DOB: 09/11/1960, 55 y.o.   MRN: [id number]  HPI This is a pleasant female presenting with cough, HA, chills, and sneezing for 3 days. She had to leave work because she was feeling so bad. States that her cough is dry, nasal drainage is clear, and her coughing makes her feel short of breath. She has had 1 episode of diarrhea. She reports chest pain from coughing and generalized muscle aches. She has also thrown up from coughing hard. Patient has been around a Midwife. She took Singulair and Advair with little to no relief this morning. She has a history of asthma that is usually well controlled, she has recently been out of her Advair and Singulair for several weeks and just restarted 4 days ago.   Past Medical History  Diagnosis Date  . Diverticulitis   . Asthma    History reviewed. No pertinent past surgical history. Family History  Problem Relation Age of Onset  . Cancer Mother   . Heart disease Father    Social History  Substance Use Topics  . Smoking status: Former Smoker    Quit date: 09/23/1998  . Smokeless tobacco: None  . Alcohol Use: No     Review of Systems  Constitutional: Positive for chills, activity change (feels tired), appetite change and fatigue.  HENT: Positive for congestion, rhinorrhea (clear), sinus pressure (frontal, maxillary) and sneezing. Negative for sore throat.   Eyes: Negative for pain.  Respiratory: Positive for cough, shortness of breath (from coughing) and wheezing. Negative for chest tightness.   Cardiovascular: Positive for chest pain (from coughing).  Gastrointestinal: Positive for nausea, vomiting (from coughing) and diarrhea (1 episode).  Musculoskeletal: Positive for myalgias and arthralgias.  Neurological: Positive for headaches.       Objective:   Physical Exam  Constitutional: She is oriented to person, place, and time. She appears well-developed and well-nourished.  HENT:    Head: Normocephalic.  Right Ear: No drainage or swelling.  Left Ear: No drainage or swelling.  Nose: Rhinorrhea (clear) present. No sinus tenderness.  Neck: Normal range of motion. No thyromegaly present.  Cardiovascular: Normal rate, regular rhythm and normal heart sounds.   Pulmonary/Chest: Effort normal. She has wheezes (bilaterally lower lobes).  Musculoskeletal: Normal range of motion.  Neurological: She is alert and oriented to person, place, and time.  Skin: Skin is warm and dry. No erythema.  Psychiatric: She has a normal mood and affect. Her behavior is normal. Judgment and thought content normal.  Recheck HR 104  Was given albuterol/atrovent neb in the office with some loosening of cough. Was given Alleve 440 mg with some relief of headache. Appeared more comfortable following treatment.   BP 104/60 mmHg  Pulse 116  Temp(Src) 100.2 F (37.9 C) (Oral)  Resp 20  Ht 5\' 1"  (1.549 m)  Wt 192 lb (87.091 kg)  BMI 36.30 kg/m2  SpO2 94%    Assessment & Plan:   1. Wheezing - albuterol (PROVENTIL) (2.5 MG/3ML) 0.083% nebulizer solution 2.5 mg; Take 3 mLs (2.5 mg total) by nebulization once. - ipratropium (ATROVENT) nebulizer solution 0.5 mg; Take 2.5 mLs (0.5 mg total) by nebulization once. - predniSONE (DELTASONE) 20 MG tablet; Take 2 tablets (40 mg total) by mouth daily.  Dispense: 10 tablet; Refill: 0  2. Cough - continue Advair and Singulair as prescribed  - chlorpheniramine-HYDROcodone (TUSSIONEX PENNKINETIC ER) 10-8 MG/5ML SUER; Take 5 mLs by mouth at bedtime  as needed for cough.  Dispense: 70 mL; Refill: 0  4. Viral Illness - likely flu, outside of treatment window for Tamiflu - RTC precautions given  Clarene Reamer, FNP-BC  [facility]  04/20/2015 12:52 PM         Aleve 440 mg given in office for headache

## 2015-04-20 NOTE — Patient Instructions (Addendum)
Drink plenty of water Get enough rest until feeling better Please come back to see [place] if not feeling better in 5-7 days, worsening cough, or wheezing occurs Wash hands after coughing and sneezing to avoid spreading the infection   Influenza, Adult Influenza ("the flu") is a viral infection of the respiratory tract. It occurs more often in winter months because people spend more time in close contact with one another. Influenza can make you feel very sick. Influenza easily spreads from person to person (contagious). CAUSES  Influenza is caused by a virus that infects the respiratory tract. You can catch the virus by breathing in droplets from an infected person's cough or sneeze. You can also catch the virus by touching something that was recently contaminated with the virus and then touching your mouth, nose, or eyes. RISKS AND COMPLICATIONS You may be at risk for a more severe case of influenza if you smoke cigarettes, have diabetes, have chronic heart disease (such as heart failure) or lung disease (such as asthma), or if you have a weakened immune system. Elderly people and pregnant women are also at risk for more serious infections. The most common problem of influenza is a lung infection (pneumonia). Sometimes, this problem can require emergency medical care and may be life threatening. SIGNS AND SYMPTOMS  Symptoms typically last 4 to 10 days and may include:  Fever.  Chills.  Headache, body aches, and muscle aches.  Sore throat.  Chest discomfort and cough.  Poor appetite.  Weakness or feeling tired.  Dizziness.  Nausea or vomiting. DIAGNOSIS  Diagnosis of influenza is often made based on your history and a physical exam. A nose or throat swab test can be done to confirm the diagnosis. TREATMENT  In mild cases, influenza goes away on its own. Treatment is directed at relieving symptoms. For more severe cases, your health care provider may prescribe antiviral medicines to  shorten the sickness. Antibiotic medicines are not effective because the infection is caused by a virus, not by bacteria. HOME CARE INSTRUCTIONS  Take medicines only as directed by your health care provider.  Use a cool mist humidifier to make breathing easier.  Get plenty of rest until your temperature returns to normal. This usually takes 3 to 4 days.  Drink enough fluid to keep your urine clear or pale yellow.  Cover yourmouth and nosewhen coughing or sneezing,and wash your handswellto prevent thevirusfrom spreading.  Stay homefromwork orschool untilthe fever is gonefor at least 75full day. PREVENTION  An annual influenza vaccination (flu shot) is the best way to avoid getting influenza. An annual flu shot is now routinely recommended for all adults in the [place] IF:  You experiencechest pain, yourcough worsens,or you producemore mucus.  Youhave nausea,vomiting, ordiarrhea.  Your fever returns or gets worse. SEEK IMMEDIATE MEDICAL CARE IF:  You havetrouble breathing, you become short of breath,or your skin ornails becomebluish.  You have severe painor stiffnessin the neck.  You develop a sudden headache, or pain in the face or ear.  You have nausea or vomiting that you cannot control. MAKE SURE YOU:   Understand these instructions.  Will watch your condition.  Will get help right away if you are not doing well or get worse.   This information is not intended to replace advice given to you by your health care provider. Make sure you discuss any questions you have with your health care provider.   Document Released: 02/08/2000 Document Revised: 03/03/2014 Document Reviewed: 05/12/2011 Elsevier  Interactive Patient Education 2016 [employer] Inc.  

## 2015-10-23 ENCOUNTER — Other Ambulatory Visit (HOSPITAL_COMMUNITY)
Admission: RE | Admit: 2015-10-23 | Discharge: 2015-10-23 | Disposition: A | Discharge status: Home / Self Care | Payer: BC Managed Care – PPO | Source: Ambulatory Visit | Attending: Family Medicine | Admitting: Family Medicine

## 2015-10-23 ENCOUNTER — Other Ambulatory Visit: Payer: Self-pay | Admitting: Physician Assistant

## 2015-10-23 DIAGNOSIS — Z124 Encounter for screening for malignant neoplasm of cervix: Secondary | ICD-10-CM | POA: Diagnosis present

## 2015-10-25 LAB — CYTOLOGY - PAP

## 2016-01-11 ENCOUNTER — Emergency Department (HOSPITAL_COMMUNITY): Payer: BC Managed Care – PPO

## 2016-01-11 ENCOUNTER — Emergency Department (HOSPITAL_COMMUNITY)
Admission: EM | Admit: 2016-01-11 | Discharge: 2016-01-11 | Disposition: A | Discharge status: Home / Self Care | Payer: BC Managed Care – PPO | Attending: Emergency Medicine | Admitting: Emergency Medicine

## 2016-01-11 ENCOUNTER — Encounter (HOSPITAL_COMMUNITY): Payer: Self-pay

## 2016-01-11 DIAGNOSIS — K5730 Diverticulosis of large intestine without perforation or abscess without bleeding: Secondary | ICD-10-CM | POA: Diagnosis not present

## 2016-01-11 DIAGNOSIS — R1030 Lower abdominal pain, unspecified: Secondary | ICD-10-CM | POA: Diagnosis present

## 2016-01-11 DIAGNOSIS — K5732 Diverticulitis of large intestine without perforation or abscess without bleeding: Secondary | ICD-10-CM

## 2016-01-11 DIAGNOSIS — J45909 Unspecified asthma, uncomplicated: Secondary | ICD-10-CM | POA: Diagnosis not present

## 2016-01-11 DIAGNOSIS — Z87891 Personal history of nicotine dependence: Secondary | ICD-10-CM | POA: Diagnosis not present

## 2016-01-11 DIAGNOSIS — Z79899 Other long term (current) drug therapy: Secondary | ICD-10-CM | POA: Insufficient documentation

## 2016-01-11 LAB — CBC WITH DIFFERENTIAL/PLATELET
Basophils Absolute: 0 10*3/uL (ref 0.0–0.1)
Basophils Relative: 0 %
Eosinophils Absolute: 0.2 10*3/uL (ref 0.0–0.7)
Eosinophils Relative: 2 %
HCT: 39.8 % (ref 36.0–46.0)
Hemoglobin: 12.7 g/dL (ref 12.0–15.0)
Lymphocytes Relative: 34 %
Lymphs Abs: 3.5 10*3/uL (ref 0.7–4.0)
MCH: 26.2 pg (ref 26.0–34.0)
MCHC: 31.9 g/dL (ref 30.0–36.0)
MCV: 82.1 fL (ref 78.0–100.0)
Monocytes Absolute: 0.6 10*3/uL (ref 0.1–1.0)
Monocytes Relative: 6 %
Neutro Abs: 6 10*3/uL (ref 1.7–7.7)
Neutrophils Relative %: 58 %
Platelets: 213 10*3/uL (ref 150–400)
RBC: 4.85 MIL/uL (ref 3.87–5.11)
RDW: 14.5 % (ref 11.5–15.5)
WBC: 10.3 10*3/uL (ref 4.0–10.5)

## 2016-01-11 LAB — URINALYSIS, ROUTINE W REFLEX MICROSCOPIC
Bilirubin Urine: NEGATIVE
Glucose, UA: NEGATIVE mg/dL
Hgb urine dipstick: NEGATIVE
Ketones, ur: 15 mg/dL — AB
Nitrite: NEGATIVE
Protein, ur: NEGATIVE mg/dL
Specific Gravity, Urine: 1.022 (ref 1.005–1.030)
pH: 7.5 (ref 5.0–8.0)

## 2016-01-11 LAB — COMPREHENSIVE METABOLIC PANEL
ALT: 27 U/L (ref 14–54)
AST: 27 U/L (ref 15–41)
Albumin: 4 g/dL (ref 3.5–5.0)
Alkaline Phosphatase: 72 U/L (ref 38–126)
Anion gap: 8 (ref 5–15)
BUN: 9 mg/dL (ref 6–20)
CO2: 27 mmol/L (ref 22–32)
Calcium: 9.5 mg/dL (ref 8.9–10.3)
Chloride: 103 mmol/L (ref 101–111)
Creatinine, Ser: 0.62 mg/dL (ref 0.44–1.00)
GFR calc Af Amer: >60 mL/min (ref >60)
GFR calc non Af Amer: >60 mL/min (ref >60)
Glucose, Bld: 99 mg/dL (ref 65–99)
Potassium: 3.8 mmol/L (ref 3.5–5.1)
Sodium: 138 mmol/L (ref 135–145)
Total Bilirubin: 0.8 mg/dL (ref 0.3–1.2)
Total Protein: 7.3 g/dL (ref 6.5–8.1)

## 2016-01-11 LAB — TROPONIN I: Troponin I: <0.03 ng/mL (ref <0.03)

## 2016-01-11 LAB — URINE MICROSCOPIC-ADD ON

## 2016-01-11 LAB — LIPASE, BLOOD: Lipase: 18 U/L (ref 11–51)

## 2016-01-11 MED ORDER — METRONIDAZOLE IN NACL 5-0.79 MG/ML-% IV SOLN
500.0000 mg | Freq: Once | INTRAVENOUS | Status: AC
  Administered 2016-01-11: 500 mg via INTRAVENOUS
  Filled 2016-01-11: qty 100

## 2016-01-11 MED ORDER — METRONIDAZOLE 500 MG PO TABS: 500.0000 mg | ORAL_TABLET | Freq: Three times a day (TID) | ORAL | 0 refills | Status: DC

## 2016-01-11 MED ORDER — DEXTROSE 5 % IV SOLN
2.0000 g | Freq: Once | INTRAVENOUS | Status: AC
  Administered 2016-01-11: 2 g via INTRAVENOUS
  Filled 2016-01-11: qty 2

## 2016-01-11 MED ORDER — AMOXICILLIN-POT CLAVULANATE 875-125 MG PO TABS: 1.0000 | ORAL_TABLET | Freq: Two times a day (BID) | ORAL | 0 refills | Status: DC

## 2016-01-11 MED ORDER — IOPAMIDOL (ISOVUE-300) INJECTION 61%
INTRAVENOUS | Status: AC
Start: 2016-01-11 — End: 2016-01-11
  Administered 2016-01-11: 100 mL
  Filled 2016-01-11: qty 100

## 2016-01-11 MED ORDER — DIAZEPAM 5 MG/ML IJ SOLN
5.0000 mg | Freq: Once | INTRAMUSCULAR | Status: AC
  Administered 2016-01-11: 5 mg via INTRAVENOUS
  Filled 2016-01-11: qty 2

## 2016-01-11 NOTE — ED Notes (Signed)
Called CT to ask when pt will be going for scan.

## 2016-01-11 NOTE — ED Notes (Signed)
Patient transported to CT 

## 2016-01-11 NOTE — ED Notes (Signed)
Patient transported to X-ray 

## 2016-01-11 NOTE — ED Triage Notes (Signed)
Pt. Having lower abdominal pain/cramping started on Wednesday.  Pt. Has not moved her bowels in 2 days.  She denies any n/v.  Skin is warm and dry.  Pt. Reports having lt. Side chest pain this morning while in shower.  The pain comes with movement. Intermittent pressure.  Pt. Has a hx of diverticulitis.  Pt. Denies any n/v/d , skin is warm and dry.

## 2016-01-11 NOTE — ED Notes (Signed)
Pt is in stable condition upon d/c and ambulates from ED. 

## 2016-01-11 NOTE — ED Notes (Signed)
Called CT to inform them pt is ready for transport and will be given valium upon their arrival to room.

## 2016-01-11 NOTE — ED Notes (Signed)
Attempted IV x2. Dr. Wilson Singer informed. Judson Roch, RN to attempt.

## 2016-01-23 NOTE — ED Provider Notes (Signed)
[facility] DEPT Provider Note   CSN: [id number] Arrival date & time: 01/11/16  0856     History   Chief Complaint Chief Complaint  Patient presents with  . Abdominal Pain  . Constipation  . Chest Pain    HPI Lauren Nicholson is a 55 y.o. female.  HPI   55 year old female with abdominal pain. Gradual onset on Wednesday. Progressively worsening since then. Pain is across lower abdomen. Somewhat worse on the left side. Pain is worse with movement. No nausea or vomiting. No urinary complaints. She has a past history of diverticulitis. She questions her current symptoms may be from this. Denies any recent blood in her stool. No diarrhea. Her last bowel movement was about 2 days ago. No respiratory complaints.  Past Medical History:  Diagnosis Date  . Asthma   . Diverticulitis     There are no active problems to display for this patient.   History reviewed. No pertinent surgical history.  OB History    No data available       Home Medications    Prior to Admission medications   Medication Sig Start Date End Date Taking? Authorizing Provider  Fluticasone-Salmeterol (ADVAIR) 100-50 MCG/DOSE AEPB Inhale 1 puff into the lungs daily.    Yes Historical Provider, MD  montelukast (SINGULAIR) 10 MG tablet Take 10 mg by mouth at bedtime.   Yes Historical Provider, MD  naproxen sodium (ANAPROX) 220 MG tablet Take 440 mg by mouth 2 (two) times daily as needed (for pain or headache).   Yes Historical Provider, MD  albuterol (PROVENTIL HFA;VENTOLIN HFA) 108 (90 BASE) MCG/ACT inhaler Inhale 2 puffs into the lungs every 4 (four) hours as needed for wheezing or shortness of breath. 01/06/15   Waynetta Pean, PA-C  albuterol (PROVENTIL) (2.5 MG/3ML) 0.083% nebulizer solution Take 2.5 mg by nebulization every 6 (six) hours as needed for wheezing or shortness of breath.    Historical Provider, MD  amoxicillin-clavulanate (AUGMENTIN) 875-125 MG tablet Take 1 tablet by mouth 2 (two) times  daily. 01/11/16   Virgel Manifold, MD  chlorpheniramine-HYDROcodone [facility] PENNKINETIC ER) 10-8 MG/5ML SUER Take 5 mLs by mouth at bedtime as needed for cough. Patient not taking: Reported on 01/11/2016 04/20/15   Elby Beck, FNP  metroNIDAZOLE (FLAGYL) 500 MG tablet Take 1 tablet (500 mg total) by mouth 3 (three) times daily. 01/11/16   Virgel Manifold, MD  predniSONE (DELTASONE) 20 MG tablet Take 2 tablets (40 mg total) by mouth daily. Patient not taking: Reported on 01/11/2016 04/20/15   Elby Beck, FNP    Family History Family History  Problem Relation Age of Onset  . Cancer Mother   . Heart disease Father     Social History Social History  Substance Use Topics  . Smoking status: Former Smoker    Quit date: 09/23/1998  . Smokeless tobacco: Never Used  . Alcohol use No     Allergies   Ciprofloxacin   Review of Systems Review of Systems  All systems reviewed and negative, other than as noted in HPI.   Physical Exam Updated Vital Signs BP 145/99   Pulse 82   Temp 98.7 F (37.1 C) (Oral)   Resp 22   Ht 4\' 11"  (1.499 m)   Wt 193 lb (87.5 kg)   SpO2 98%   BMI 38.98 kg/m   Physical Exam  Constitutional: She appears well-developed and well-nourished. No distress.  HENT:  Head: Normocephalic and atraumatic.  Eyes: Conjunctivae are normal. Right eye  exhibits no discharge. Left eye exhibits no discharge.  Neck: Neck supple.  Cardiovascular: Normal rate, regular rhythm and normal heart sounds.  Exam reveals no gallop and no friction rub.   No murmur heard. Pulmonary/Chest: Effort normal and breath sounds normal. No respiratory distress.  Abdominal: Soft. She exhibits no distension. There is no tenderness.  Musculoskeletal: She exhibits no edema or tenderness.  Neurological: She is alert.  Skin: Skin is warm and dry.  Psychiatric: She has a normal mood and affect. Her behavior is normal. Thought content normal.  Nursing note and vitals  reviewed.    ED Treatments / Results  Labs (all labs ordered are listed, but only abnormal results are displayed) Labs Reviewed  URINALYSIS, ROUTINE W REFLEX MICROSCOPIC (NOT AT [facility]) - Abnormal; Notable for the following:       Result Value   APPearance CLOUDY (*)    Ketones, ur 15 (*)    Leukocytes, UA MODERATE (*)    All other components within normal limits  URINE MICROSCOPIC-ADD ON - Abnormal; Notable for the following:    Squamous Epithelial / LPF 6-30 (*)    Bacteria, UA FEW (*)    All other components within normal limits  CBC WITH DIFFERENTIAL/PLATELET  COMPREHENSIVE METABOLIC PANEL  TROPONIN I  LIPASE, BLOOD    EKG  EKG Interpretation  Date/Time:  Friday January 11 2016 09:28:44 EST Ventricular Rate:  90 PR Interval:    QRS Duration: 94 QT Interval:  371 QTC Calculation: 454 R Axis:   -9 Text Interpretation:  Sinus rhythm Low voltage, precordial leads Abnormal R-wave progression, early transition No significant change since last tracing Confirmed by [facility] MD, PEDRO ([id number]) on 01/12/2016 5:54:53 PM       Radiology No results found.   Dg Chest 2 View  Result Date: 01/11/2016 CLINICAL DATA:  Intermittent chest pain and abdominal pain/ cramping. EXAM: CHEST  2 VIEW COMPARISON:  01/06/2015 FINDINGS: The cardiac silhouette, mediastinal an hilar contours are within normal limits and stable. Minimal tortuosity and calcification of the thoracic aorta. The lungs are clear. No pleural effusion. Artifact noted from EKG leads. The bony thorax is intact. IMPRESSION: No acute cardiopulmonary findings. Stable minimal aortic atherosclerosis. Electronically Signed   By: Marijo Sanes M.D.   On: 01/11/2016 10:26   Ct Abdomen Pelvis W Contrast  Result Date: 01/11/2016 CLINICAL DATA:  Lower abdominal pain, cramping EXAM: CT ABDOMEN AND PELVIS WITH CONTRAST TECHNIQUE: Multidetector CT imaging of the abdomen and pelvis was performed using the standard protocol following bolus  administration of intravenous contrast. CONTRAST:  146mL ISOVUE-300 IOPAMIDOL (ISOVUE-300) INJECTION 61% COMPARISON:  None. FINDINGS: Lower chest: No acute abnormality. Hepatobiliary: 14 mm hypodense, fluid attenuating left hepatic mass anteriorly most consistent with a cyst slightly enlarged compared to the prior exam. Normal gallbladder. No intrahepatic or extrahepatic biliary ductal dilatation. Pancreas: Unremarkable. No pancreatic ductal dilatation or surrounding inflammatory changes. Spleen: Normal in size without focal abnormality. Adrenals/Urinary Tract: Normal adrenal glands. Stable 9 mm right midpole renal cyst. Kidneys otherwise normal. No obstructive uropathy. No renal calculus. Normal bladder. Stomach/Bowel: No bowel dilatation to suggest obstruction. Descending colon and sigmoid colon diverticulosis. Focal bowel wall thickening and pericolonic inflammatory changes at the descending colon - sigmoid colon junction most consistent with acute diverticulitis. No pericolonic fluid collection to suggest an abscess. Vascular/Lymphatic: No significant vascular findings are present. No enlarged abdominal or pelvic lymph nodes. Reproductive: Normal uterus. 5.1 x 3.7 cm hypodense, fluid attenuating right ovarian mass unchanged compared 04/18/2013. Other:  No fluid collection or hematoma. No abdominal wall hernia. No abdominal or pelvic ascites. Musculoskeletal: No acute osseous abnormality. No lytic or sclerotic osseous lesion. IMPRESSION: 1. Acute diverticulitis of the descending colon-sigmoid colon junction. No focal fluid collection to suggest an abscess. 2. Right ovarian cystic mass slightly enlarged compared with the prior exam of 03/01/2010. If there is further clinical concern, recommend GYN consultation. Electronically Signed   By: Kathreen Devoid   On: 01/11/2016 13:55    Procedures Procedures (including critical care time)  Medications Ordered in ED Medications  iopamidol (ISOVUE-300) 61 % injection  (100 mLs  Contrast Given 01/11/16 1140)  diazepam (VALIUM) injection 5 mg (5 mg Intravenous Given 01/11/16 1326)  metroNIDAZOLE (FLAGYL) IVPB 500 mg (0 mg Intravenous Stopped 01/11/16 1629)  cefTRIAXone (ROCEPHIN) 2 g in dextrose 5 % 50 mL IVPB (0 g Intravenous Stopped 01/11/16 1516)     Initial Impression / Assessment and Plan / ED Course  I have reviewed the triage vital signs and the nursing notes.  Pertinent labs & imaging results that were available during my care of the patient were reviewed by me and considered in my medical decision making (see chart for details).  Clinical Course     55 year old female with diverticulitis without perforation or abscess. She is nontoxic. She is tolerating oral intake. I feel she is appropriate for outpatient treatment at this time. Return precautions were discussed.  Final Clinical Impressions(s) / ED Diagnoses   Final diagnoses:  Diverticulitis of large intestine without perforation or abscess without bleeding    New Prescriptions Discharge Medication List as of 01/11/2016  4:40 PM    START taking these medications   Details  amoxicillin-clavulanate (AUGMENTIN) 875-125 MG tablet Take 1 tablet by mouth 2 (two) times daily., Starting Fri 01/11/2016, Print    metroNIDAZOLE (FLAGYL) 500 MG tablet Take 1 tablet (500 mg total) by mouth 3 (three) times daily., Starting Fri 01/11/2016, Print         Virgel Manifold, MD 01/23/16 1427

## 2017-03-18 ENCOUNTER — Encounter (HOSPITAL_COMMUNITY): Payer: Self-pay | Admitting: Emergency Medicine

## 2017-03-18 ENCOUNTER — Emergency Department (HOSPITAL_BASED_OUTPATIENT_CLINIC_OR_DEPARTMENT_OTHER)
Admit: 2017-03-18 | Discharge: 2017-03-18 | Disposition: A | Discharge status: Home / Self Care | Payer: BC Managed Care – PPO | Attending: Student | Admitting: Student

## 2017-03-18 ENCOUNTER — Emergency Department (HOSPITAL_COMMUNITY)
Admission: EM | Admit: 2017-03-18 | Discharge: 2017-03-18 | Disposition: A | Discharge status: Home / Self Care | Payer: BC Managed Care – PPO | Attending: Emergency Medicine | Admitting: Emergency Medicine

## 2017-03-18 ENCOUNTER — Emergency Department (HOSPITAL_COMMUNITY): Payer: BC Managed Care – PPO

## 2017-03-18 DIAGNOSIS — M79609 Pain in unspecified limb: Secondary | ICD-10-CM

## 2017-03-18 DIAGNOSIS — M25562 Pain in left knee: Secondary | ICD-10-CM | POA: Insufficient documentation

## 2017-03-18 DIAGNOSIS — G8929 Other chronic pain: Secondary | ICD-10-CM | POA: Diagnosis not present

## 2017-03-18 DIAGNOSIS — M25561 Pain in right knee: Secondary | ICD-10-CM | POA: Diagnosis present

## 2017-03-18 DIAGNOSIS — Z87891 Personal history of nicotine dependence: Secondary | ICD-10-CM | POA: Diagnosis not present

## 2017-03-18 MED ORDER — LIDOCAINE 5 % EX PTCH: 1.0000 | MEDICATED_PATCH | CUTANEOUS | 0 refills | Status: DC

## 2017-03-18 NOTE — ED Triage Notes (Signed)
Patient c/o bilat knee pain. reports that she already seen orthopedic for her knee pain/problems. Waiting on her insurance to approve gel injections instead of cortisone injections.

## 2017-03-18 NOTE — ED Notes (Signed)
Bed: WTR5 Expected date:  Expected time:  Means of arrival:  Comments: 

## 2017-03-18 NOTE — ED Provider Notes (Signed)
[facility] DEPT Provider Note   CSN: [id number] Arrival date & time: 03/18/17  0240     History   Chief Complaint Chief Complaint  Patient presents with  . Knee Pain    HPI Lauren Nicholson is a 57 y.o. female.  HPI 57 year old African-American female past medical history significant for chronic bilateral knee pain presents to the emergency department today for evaluation of bilateral knee pain.  Patient states that her pain ongoing for several years.  She is being seen currently by orthopedics who has recommended that patient had a knee replacement.  Patient states that she is not able to have this at this time.  They had discussed gel injections into her knee.  She is waiting for insurance to approve the injections.  Patient reports worsening pain over the past several days.  Unable to see the orthopedic doctor.  Patient also states that she has had some swelling in her right leg and is concerned that she may have a blood clot or Baker's cyst.  Patient has been taking her prescribed tramadol, Celebrex and Tylenol No. 3 but only has minimal help with her pain.  Patient is also requesting 3 days off of work due to her acute on chronic pain exacerbation today.  Nothing makes her symptoms better or worse.  Patient is able to ambulate with pain.  Denies any history of DVT.  Denies any associated weakness or paresthesias. Past Medical History:  Diagnosis Date  . Asthma   . Diverticulitis     There are no active problems to display for this patient.   History reviewed. No pertinent surgical history.  OB History    No data available       Home Medications    Prior to Admission medications   Medication Sig Start Date End Date Taking? Authorizing Provider  albuterol (PROVENTIL HFA;VENTOLIN HFA) 108 (90 BASE) MCG/ACT inhaler Inhale 2 puffs into the lungs every 4 (four) hours as needed for wheezing or shortness of breath. 01/06/15   Waynetta Pean, PA-C    albuterol (PROVENTIL) (2.5 MG/3ML) 0.083% nebulizer solution Take 2.5 mg by nebulization every 6 (six) hours as needed for wheezing or shortness of breath.    [provider]  amoxicillin-clavulanate (AUGMENTIN) 875-125 MG tablet Take 1 tablet by mouth 2 (two) times daily. 01/11/16   Virgel Manifold, MD  chlorpheniramine-HYDROcodone [facility] PENNKINETIC ER) 10-8 MG/5ML SUER Take 5 mLs by mouth at bedtime as needed for cough. Patient not taking: Reported on 01/11/2016 04/20/15   Elby Beck, FNP  Fluticasone-Salmeterol (ADVAIR) 100-50 MCG/DOSE AEPB Inhale 1 puff into the lungs daily.     [provider]  lidocaine (LIDODERM) 5 % Place 1 patch onto the skin daily. Remove & Discard patch within 12 hours or as directed by MD 03/18/17   Doristine Devoid, PA-C  metroNIDAZOLE (FLAGYL) 500 MG tablet Take 1 tablet (500 mg total) by mouth 3 (three) times daily. 01/11/16   Virgel Manifold, MD  montelukast (SINGULAIR) 10 MG tablet Take 10 mg by mouth at bedtime.    [provider]  naproxen sodium (ANAPROX) 220 MG tablet Take 440 mg by mouth 2 (two) times daily as needed (for pain or headache).    [provider]  predniSONE (DELTASONE) 20 MG tablet Take 2 tablets (40 mg total) by mouth daily. Patient not taking: Reported on 01/11/2016 04/20/15   Elby Beck, FNP    Family History Family History  Problem Relation Age of  Onset  . Cancer Mother   . Heart disease Father     Social History Social History   Tobacco Use  . Smoking status: Former Smoker    Last attempt to quit: 09/23/1998    Years since quitting: 18.4  . Smokeless tobacco: Never Used  Substance Use Topics  . Alcohol use: No  . Drug use: No     Allergies   Ciprofloxacin   Review of Systems Review of Systems  Constitutional: Negative for fever.  Respiratory: Negative for shortness of breath.   Cardiovascular: Positive for leg swelling. Negative for chest pain.  Musculoskeletal:  Positive for arthralgias, gait problem, joint swelling and myalgias.  Skin: Negative for color change and rash.  Neurological: Negative for weakness and numbness.     Physical Exam Updated Vital Signs BP 140/70   Pulse 76   Temp 97.6 F (36.4 C) (Oral)   Resp 18   SpO2 98%   Physical Exam  Constitutional: She appears well-developed and well-nourished. No distress.  HENT:  Head: Normocephalic and atraumatic.  Eyes: Right eye exhibits no discharge. Left eye exhibits no discharge. No scleral icterus.  Neck: Normal range of motion.  Cardiovascular: Intact distal pulses.  Pulmonary/Chest: No respiratory distress.  Musculoskeletal: Normal range of motion.  Patient has full range of motion of all joints of the lower extremity.  There is no erythema, edema, warmth over any of the lower extremity joints.  Patient has no noticeable lower extremity edema.  No joint laxity with varus and valgus stress.  Negative anterior drawer test bilaterally.  DP pulses are 2+ bilaterally.  Sensation intact in all dermatomes.  Cap refill is normal.  The patella tracks normally.  She does have some pain with palpation behind the right gluteal fossa with some minimal right-sided calf tenderness.  Neurological: She is alert.  Skin: Skin is warm and dry. Capillary refill takes less than 2 seconds. No rash noted. No pallor.  Psychiatric: Her behavior is normal. Judgment and thought content normal.  Nursing note and vitals reviewed.    ED Treatments / Results  Labs (all labs ordered are listed, but only abnormal results are displayed) Labs Reviewed - No data to display  EKG  EKG Interpretation None       Radiology Dg Knee Complete 4 Views Right  Result Date: 03/18/2017 CLINICAL DATA:  Chronic right knee pain.  No injury EXAM: RIGHT KNEE - COMPLETE 4+ VIEW COMPARISON:  None. FINDINGS: Moderate tricompartment degenerative changes, most pronounced in the medial compartment with joint space narrowing and  spurring. No joint effusion. No acute bony abnormality. Specifically, no fracture, subluxation, or dislocation. IMPRESSION: Moderate tricompartment degenerative changes, most pronounced in the medial compartment. No acute bony abnormality. Electronically Signed   By: Rolm Baptise M.D.   On: 03/18/2017 11:49    Procedures Procedures (including critical care time)  Medications Ordered in ED Medications - No data to display   Initial Impression / Assessment and Plan / ED Course  I have reviewed the triage vital signs and the nursing notes.  Pertinent labs & imaging results that were available during my care of the patient were reviewed by me and considered in my medical decision making (see chart for details).     Patient presents the ED with acute on chronic bilateral knee pain right worse than left.  She also reports some right lower extremity edema and pain in the popliteal fossa.  No new injuries.  Patient is requesting work note, pain medication  and x-rays.  Patient is currently followed by orthopedics.  X-rays reveal degenerative changes but no acute findings such as a large joint effusion.  Ultrasound was obtained to rule out Baker's cyst and DVT which was normal.  Discussed with patient continue to take NSAIDs and Tylenol at home.  We will also prescribe her lidocaine patches.  Discussed with patient that I can only give her 1 day out of work that she needs to talk to her primary care doctor or orthopedic doctor for further days out if necessary.  Patient is neurovascularly intact in all extremities.  Pt is hemodynamically stable, in NAD, & able to ambulate in the ED. Evaluation does not show pathology that would require ongoing emergent intervention or inpatient treatment. I explained the diagnosis to the patient. Pain has been managed & has no complaints prior to dc. Pt is comfortable with above plan and is stable for discharge at this time. All questions were answered prior to disposition.  Strict return precautions for f/u to the ED were discussed. Encouraged follow up with PCP.   Final Clinical Impressions(s) / ED Diagnoses   Final diagnoses:  Chronic pain of both knees    ED Discharge Orders        Ordered    lidocaine (LIDODERM) 5 %  Every 24 hours     03/18/17 1234       Aaron Edelman 03/18/17 2006    Gareth Morgan, MD 03/19/17 [phone]

## 2017-03-18 NOTE — ED Notes (Signed)
US at bedside

## 2017-03-18 NOTE — Progress Notes (Signed)
*  PRELIMINARY RESULTS* Vascular Ultrasound Right lower extremity venous duplex has been completed.  Preliminary findings: No evidence of deep vein thrombosis or baker's cysts in the right lower extremity.  Small hypoechoic area seen anterior right knee, etiology unknown.   Everrett Coombe 03/18/2017, 12:26 PM

## 2017-03-18 NOTE — Discharge Instructions (Signed)
Your ultrasound did not show any signs of a Baker's cyst or DVT.  Your x-ray does show significant degenerative changes in your knee.  Continue to follow-up with orthopedics.  I would continue taking your NSAIDs which is the Celebrex avoid any other NSAIDs such as ibuprofen, Aleve, Motrin.  I also given you lidocaine patch to place of your knee to help with pain.  Continue to put ice and rest and elevate the knee.  I would also make sure you are taking Tylenol around-the-clock as prescribed back in the bottle.  Return to the ED with any worsening symptoms.

## 2017-06-15 ENCOUNTER — Encounter (HOSPITAL_COMMUNITY): Payer: Self-pay | Admitting: Family Medicine

## 2017-06-15 ENCOUNTER — Ambulatory Visit (HOSPITAL_COMMUNITY)
Admission: EM | Admit: 2017-06-15 | Discharge: 2017-06-15 | Disposition: A | Discharge status: Home / Self Care | Payer: BC Managed Care – PPO | Attending: Emergency Medicine | Admitting: Emergency Medicine

## 2017-06-15 DIAGNOSIS — S46312A Strain of muscle, fascia and tendon of triceps, left arm, initial encounter: Secondary | ICD-10-CM

## 2017-06-15 MED ORDER — PREDNISONE 50 MG PO TABS: ORAL_TABLET | ORAL | 0 refills | Status: DC

## 2017-06-15 MED ORDER — METHOCARBAMOL 500 MG PO TABS: 500.0000 mg | ORAL_TABLET | Freq: Two times a day (BID) | ORAL | 0 refills | Status: DC

## 2017-06-15 NOTE — ED Provider Notes (Signed)
[facility]    CSN: [id number] Arrival date & time: 06/15/17  1608     History   Chief Complaint Chief Complaint  Patient presents with  . Arm Pain    HPI Lauren Nicholson is a 57 y.o. female.   The history is provided by the patient.  Shoulder Pain  Location:  Shoulder Pain details:    Quality:  Aching, cramping and throbbing   Radiates to:  Does not radiate   Severity:  Moderate   Onset quality:  Gradual   Duration:  5 days   Timing:  Intermittent   Progression:  Waxing and waning Handedness:  Right-handed Dislocation: no   Foreign body present:  No foreign bodies Prior injury to area:  No Relieved by:  Rest and being still Worsened by:  Movement Associated symptoms: numbness   Associated symptoms: no back pain, no decreased range of motion, no fatigue, no fever, no muscle weakness, no neck pain, no stiffness, no swelling and no tingling   Risk factors: no frequent fractures and no recent illness     Past Medical History:  Diagnosis Date  . Asthma   . Diverticulitis     There are no active problems to display for this patient.   History reviewed. No pertinent surgical history.  OB History   None      Home Medications    Prior to Admission medications   Medication Sig Start Date End Date Taking? Authorizing Provider  diclofenac (VOLTAREN) 75 MG EC tablet Take 75 mg by mouth 2 (two) times daily.   Yes [provider]  albuterol (PROVENTIL HFA;VENTOLIN HFA) 108 (90 BASE) MCG/ACT inhaler Inhale 2 puffs into the lungs every 4 (four) hours as needed for wheezing or shortness of breath. 01/06/15   Waynetta Pean, PA-C  albuterol (PROVENTIL) (2.5 MG/3ML) 0.083% nebulizer solution Take 2.5 mg by nebulization every 6 (six) hours as needed for wheezing or shortness of breath.    [provider]  Fluticasone-Salmeterol (ADVAIR) 100-50 MCG/DOSE AEPB Inhale 1 puff into the lungs daily.     [provider]  lidocaine (LIDODERM)  5 % Place 1 patch onto the skin daily. Remove & Discard patch within 12 hours or as directed by MD 03/18/17   Doristine Devoid, PA-C  methocarbamol (ROBAXIN) 500 MG tablet Take 1 tablet (500 mg total) by mouth 2 (two) times daily. 06/15/17   Barnet Glasgow, NP  montelukast (SINGULAIR) 10 MG tablet Take 10 mg by mouth at bedtime.    [provider]  predniSONE (DELTASONE) 50 MG tablet Take 1 tablet daily with food 06/15/17   Barnet Glasgow, NP    Family History Family History  Problem Relation Age of Onset  . Cancer Mother   . Heart disease Father     Social History Social History   Tobacco Use  . Smoking status: Former Smoker    Last attempt to quit: 09/23/1998    Years since quitting: 18.7  . Smokeless tobacco: Never Used  Substance Use Topics  . Alcohol use: No  . Drug use: No     Allergies   Ciprofloxacin   Review of Systems Review of Systems  Constitutional: Negative for fatigue and fever.  HENT: Negative.   Respiratory: Negative.  Negative for cough, chest tightness and shortness of breath.   Cardiovascular: Negative for chest pain and palpitations.  Gastrointestinal: Negative for abdominal pain, diarrhea, nausea and vomiting.  Musculoskeletal: Negative for back pain, neck pain and stiffness.  Neurological: Negative for dizziness and headaches.     Physical Exam Triage Vital Signs ED Triage Vitals  Enc Vitals Group     BP 06/15/17 1647 135/83     Pulse Rate 06/15/17 1647 (!) 102     Resp 06/15/17 1647 18     Temp 06/15/17 1647 98.6 F (37 C)     Temp src --      SpO2 06/15/17 1647 98 %     Weight --      Height --      Head Circumference --      Peak Flow --      Pain Score 06/15/17 1644 8     Pain Loc --      Pain Edu? --      Excl. in [place]? --    No data found.  Updated Vital Signs BP 135/83   Pulse (!) 102   Temp 98.6 F (37 C)   Resp 18   SpO2 98%   Visual Acuity Right Eye Distance:   Left Eye Distance:   Bilateral  Distance:    Right Eye Near:   Left Eye Near:    Bilateral Near:     Physical Exam  Constitutional: She appears well-developed and well-nourished.  HENT:  Head: Normocephalic and atraumatic.  Right Ear: External ear normal.  Left Ear: External ear normal.  Eyes: Conjunctivae are normal.  Cardiovascular: Normal rate and regular rhythm.  Pulmonary/Chest: Effort normal and breath sounds normal.  Musculoskeletal: She exhibits tenderness. She exhibits no edema.       Left shoulder: She exhibits tenderness and pain. She exhibits normal range of motion, no swelling and no spasm.  Neurological: She is alert.  Skin: Skin is warm. Capillary refill takes less than 2 seconds.  Nursing note and vitals reviewed.    UC Treatments / Results  Labs (all labs ordered are listed, but only abnormal results are displayed) Labs Reviewed - No data to display  EKG None Radiology No results found.  Procedures Procedures (including critical care time)  Medications Ordered in UC Medications - No data to display   Initial Impression / Assessment and Plan / UC Course  I have reviewed the triage vital signs and the nursing notes.  Pertinent labs & imaging results that were available during my care of the patient were reviewed by me and considered in my medical decision making (see chart for details).      Index of suspicion for any cardiac causes quite low with this patient, most likely muscle strain of the left shoulder, triceps muscle. Recommend rest, ice, will provide ascription of Robaxin, and prednisone. Recommend following up with orthopedics if pain persists, or ER if pain worsens.      Final Clinical Impressions(s) / UC Diagnoses   Final diagnoses:  Triceps strain, left, initial encounter    ED Discharge Orders        Ordered    predniSONE (DELTASONE) 50 MG tablet     06/15/17 1710    methocarbamol (ROBAXIN) 500 MG tablet  2 times daily     06/15/17 1710        Controlled Substance Prescriptions [place] Controlled Substance Registry consulted? Not Applicable   Barnet Glasgow, NP 06/15/17 2112

## 2017-06-15 NOTE — ED Triage Notes (Signed)
Pt here for left arm pain and aching for the past few days. sts some numbness in hand. She denies any injury. Denies any chest pain, SOB.

## 2017-06-15 NOTE — ED Notes (Signed)
S: Lauren Nicholson is a 57 y.o. female who presents with five-day history left shoulder pain. Pain is worsened with movement, also stating "hurts more when she laughs" started experiencing numbness along her left hand today. Has no past history of cardiac disease. No first-degree relative, mother, father, brother, sister, with past history of heart disease. She works as a Development worker, international aid. She is right hand dominant. Denies any injury, no falls, indices, or other trauma, she does have she describes a repetitive motion within her work.  Review of Systems  Constitutional: Negative for chills and fever.  HENT: Negative.   Respiratory: Negative for cough and shortness of breath.   Cardiovascular: Negative for chest pain, palpitations and orthopnea.  Gastrointestinal: Negative for diarrhea, nausea and vomiting.  Musculoskeletal: Positive for joint pain.  Neurological: Negative for dizziness and headaches.   O: Vitals:   06/15/17 1647  BP: 135/83  Pulse: (!) 102  Resp: 18  Temp: 98.6 F (37 C)  SpO2: 98%   Physical Exam  Constitutional: She appears well-developed and well-nourished. No distress.  HENT:  Head: Normocephalic and atraumatic.  Right Ear: External ear normal.  Left Ear: External ear normal.  Eyes: Conjunctivae are normal.  Neck: Normal range of motion.  Cardiovascular: Normal rate and regular rhythm.  Pulmonary/Chest: Effort normal and breath sounds normal.  Musculoskeletal: She exhibits no edema.       Left shoulder: She exhibits tenderness and pain. She exhibits no swelling, no deformity and no spasm.  Reproducible pain with palpation along the left triceps muscle, along with pain with extension of the shoulder. No pain with internal and external rotation, equal grip strength, negative can test, negative or pain to palpation of the insertion of biceps muscle.  Neurological: She is alert.  Skin: Skin is warm and dry. Capillary refill takes less than 2 seconds. She  is not diaphoretic.  Nursing note and vitals reviewed.  A: 1. Triceps strain, left, initial encounter    P:  Index of suspicion for any cardiac causes quite low with this patient, most likely muscle strain of the left shoulder, triceps muscle. Recommend rest, ice, will provide ascription of Robaxin, and prednisone. Recommend following up with orthopedics if pain persists, or ER if pain worsens.   Barnet Glasgow, NP 06/15/17 1726

## 2017-06-15 NOTE — Discharge Instructions (Signed)
Rest, ice as needed, range of motion exercises attached as tolerated, follow up with your orthopedist if symptoms persist. Return if symptoms worsen

## 2017-06-29 ENCOUNTER — Emergency Department (HOSPITAL_COMMUNITY)
Admission: EM | Admit: 2017-06-29 | Discharge: 2017-06-29 | Disposition: A | Discharge status: Home / Self Care | Payer: BC Managed Care – PPO | Attending: Emergency Medicine | Admitting: Emergency Medicine

## 2017-06-29 ENCOUNTER — Other Ambulatory Visit: Payer: Self-pay

## 2017-06-29 ENCOUNTER — Encounter (HOSPITAL_COMMUNITY): Payer: Self-pay | Admitting: Emergency Medicine

## 2017-06-29 DIAGNOSIS — Z87891 Personal history of nicotine dependence: Secondary | ICD-10-CM | POA: Diagnosis not present

## 2017-06-29 DIAGNOSIS — M542 Cervicalgia: Secondary | ICD-10-CM | POA: Diagnosis present

## 2017-06-29 DIAGNOSIS — M5412 Radiculopathy, cervical region: Secondary | ICD-10-CM | POA: Insufficient documentation

## 2017-06-29 DIAGNOSIS — J45909 Unspecified asthma, uncomplicated: Secondary | ICD-10-CM | POA: Insufficient documentation

## 2017-06-29 DIAGNOSIS — Z79899 Other long term (current) drug therapy: Secondary | ICD-10-CM | POA: Diagnosis not present

## 2017-06-29 MED ORDER — METHYLPREDNISOLONE 4 MG PO TBPK: ORAL_TABLET | ORAL | 0 refills | Status: DC

## 2017-06-29 NOTE — ED Provider Notes (Signed)
[facility] EMERGENCY DEPARTMENT Provider Note   CSN: [id number] Arrival date & time: 06/29/17  1055     History   Chief Complaint Chief Complaint  Patient presents with  . arm tingling    left  . arm numbness    HPI LETASHA KERSHAW is a 57 y.o. female who presents the emergency department today for neck pain with numbness and tingling that runs down her left shoulder into her left hand/fingers.  Patient notes that she was seen at urgent care for this on anti-inflammatory medication that helped.  She followed up with orthopedics who prescribed her Tylenol 3 after getting imaging that diagnosed her with a pinched nerve.  She states the Tylenol 3 is not helping and she is requesting something further for pain.  She notes that the pain is constant and is not aggravated by anything.  He denies any fever, chills, headache, visual changes, facial droop, weakness on one side of the body, bowel/bladder incontinence, saddle anesthesia, urinary retention, chest pain, shortness of breath, nausea or vomiting, diaphoresis or trauma.  HPI  Past Medical History:  Diagnosis Date  . Asthma   . Diverticulitis     There are no active problems to display for this patient.   No past surgical history on file.   OB History   None      Home Medications    Prior to Admission medications   Medication Sig Start Date End Date Taking? Authorizing Provider  albuterol (PROVENTIL HFA;VENTOLIN HFA) 108 (90 BASE) MCG/ACT inhaler Inhale 2 puffs into the lungs every 4 (four) hours as needed for wheezing or shortness of breath. 01/06/15   Waynetta Pean, PA-C  albuterol (PROVENTIL) (2.5 MG/3ML) 0.083% nebulizer solution Take 2.5 mg by nebulization every 6 (six) hours as needed for wheezing or shortness of breath.    [provider]  diclofenac (VOLTAREN) 75 MG EC tablet Take 75 mg by mouth 2 (two) times daily.    [provider]  Fluticasone-Salmeterol (ADVAIR) 100-50  MCG/DOSE AEPB Inhale 1 puff into the lungs daily.     [provider]  lidocaine (LIDODERM) 5 % Place 1 patch onto the skin daily. Remove & Discard patch within 12 hours or as directed by MD 03/18/17   Doristine Devoid, PA-C  methocarbamol (ROBAXIN) 500 MG tablet Take 1 tablet (500 mg total) by mouth 2 (two) times daily. 06/15/17   Barnet Glasgow, NP  montelukast (SINGULAIR) 10 MG tablet Take 10 mg by mouth at bedtime.    [provider]  predniSONE (DELTASONE) 50 MG tablet Take 1 tablet daily with food 06/15/17   Barnet Glasgow, NP    Family History Family History  Problem Relation Age of Onset  . Cancer Mother   . Heart disease Father     Social History Social History   Tobacco Use  . Smoking status: Former Smoker    Last attempt to quit: 09/23/1998    Years since quitting: 18.7  . Smokeless tobacco: Never Used  Substance Use Topics  . Alcohol use: No  . Drug use: No     Allergies   Ciprofloxacin   Review of Systems Review of Systems  All other systems reviewed and are negative.    Physical Exam Updated Vital Signs BP (!) 131/91 (BP Location: Right Arm)   Pulse 83   Temp 98.5 F (36.9 C) (Oral)   Resp 18   SpO2 100%   Physical Exam  Constitutional: She appears well-developed and  well-nourished.  HENT:  Head: Normocephalic and atraumatic.  Right Ear: External ear normal.  Left Ear: External ear normal.  Eyes: Conjunctivae are normal. Right eye exhibits no discharge. Left eye exhibits no discharge. No scleral icterus.  Cardiovascular:  Pulses:      Radial pulses are 2+ on the right side, and 2+ on the left side.  Pulmonary/Chest: Effort normal. No respiratory distress.  Musculoskeletal:  Cervical Spine: Appearance normal. No obvious bony deformity. No skin swelling, erythema, heat, fluctuance or break of the skin. No TTP over the cervical spinous processes. Left paraspinal tenderness. No step-offs. Patient is able to actively rotate  their neck 45 degrees left and right voluntarily without pain and flex and extend the neck without pain. Positive Spurling's  Left Shoulder: Appearance normal. No obvious bony deformity. No skin swelling, erythema, heat, fluctuance or break of the skin. No clavicular deformity or TTP. No TTP over shoulder or scapula. Active and passive flexion, extension, abduction, adduction, and internal/external rotation intact without pain or crepitus. Strength for flexion, extension, abduction, adduction, and internal/external rotation intact and appropriate for age. Negative Hawkin's test. Negative Neer's test.  Left Elbow: Appearance normal. No obvious bony deformity. No skin swelling, erythema, heat, fluctuance or break of the skin. No TTP over joint. Active flexion, extension, supination and pronation full and intact without pain. Strength able and appropriate for age for flexion and extension.  Radial Pulse 2+. Cap refill <2 seconds. SILT for M/U/R distributions. Compartments soft.   Neurological: She is alert. She has normal strength. No sensory deficit.  Speech clear. Follows commands. No facial droop. PERRLA. EOM grossly intact. CN III-XII grossly intact. Grossly moves all extremities 4 without ataxia. Able and appropriate strength for age to upper and lower extremities bilaterally. Normal gait.   Skin: Skin is warm and dry. Capillary refill takes less than 2 seconds. No erythema. No pallor.  Psychiatric: She has a normal mood and affect.  Nursing note and vitals reviewed.    ED Treatments / Results  Labs (all labs ordered are listed, but only abnormal results are displayed) Labs Reviewed - No data to display  EKG None  Radiology No results found.  Procedures Procedures (including critical care time)  Medications Ordered in ED Medications - No data to display   Initial Impression / Assessment and Plan / ED Course  I have reviewed the triage vital signs and the nursing notes.  Pertinent  labs & imaging results that were available during my care of the patient were reviewed by me and considered in my medical decision making (see chart for details).     57 year old female is currently followed by orthopedics with symptoms consistent with cervical radiculopathy.  Patient periods with neck pain that radiates down her left arm.  She is a positive Spurling's test.  She is neurovascularly intact.  No imaging indicated at this time.  She states she has had prior imaging but I am unable to see at this time.  Patient was previously treated with naproxen and Tylenol 3.  Will give steroids.  She has follow-up with Dr. Berenice Primas she reports next week. Will have her keep this appointment.  Advised conservative therapies at home including. Specific return precautions discussed. Time was given for all questions to be answered. The patient verbalized understanding and agreement with plan. The patient appears safe for discharge home.  Final Clinical Impressions(s) / ED Diagnoses   Final diagnoses:  Cervical radiculopathy    ED Discharge Orders  Ordered    methylPREDNISolone (MEDROL DOSEPAK) 4 MG TBPK tablet     06/29/17 1237       Lorelle Gibbs 06/29/17 1238    Daleen Bo, MD 06/30/17 [phone]

## 2017-06-29 NOTE — ED Notes (Signed)
Patient verbalizes understanding of discharge instructions. Opportunity for questioning and answers were provided. Armband removed by staff, pt discharged from ED ambulatory.   

## 2017-06-29 NOTE — ED Notes (Signed)
Pt states she was given 5 days of prednisone (not tapered) a few weeks ago and felt like that helped her pain, but said it didn't fix anything, only put a band-aid on it.

## 2017-06-29 NOTE — ED Triage Notes (Signed)
Pt states she has had left arm tingling and numbness with arm pain for 3 weeks, saw urgent care and orthopedic who gave her tylenol and a diagnosis of a pinched nerve. Pt states all they gave her was tylenol and she is not going to take it because it is not helping. Denies chest pain. Denies neurological deficits. Pain to arm is worse with movement, the arm hurts when she lifts it. Was told it was the nerve in her neck.

## 2017-06-29 NOTE — Discharge Instructions (Signed)
Follow attached handouts.  Take steroids as prescribed.  Please note I am starting on steroids.  This medication can elevate your blood sugars if you are diabetic.  Please monitor these at home.  Please note that this medication can cause you to be irritable, increased hunger, weight gain, poor sleep and agitation. Please keep appointment with your orthopedist next week.  Continue pain medication at home. Please use heat therapy at home in addition (see handout).  If you develop worsening or new concerning symptoms you can return to the emergency department for re-evaluation.

## 2017-07-03 ENCOUNTER — Other Ambulatory Visit: Payer: Self-pay | Admitting: Orthopedic Surgery

## 2017-07-03 DIAGNOSIS — M4722 Other spondylosis with radiculopathy, cervical region: Secondary | ICD-10-CM

## 2017-07-07 ENCOUNTER — Other Ambulatory Visit (HOSPITAL_COMMUNITY): Payer: Self-pay | Admitting: Orthopedic Surgery

## 2017-07-07 DIAGNOSIS — M5412 Radiculopathy, cervical region: Secondary | ICD-10-CM

## 2017-07-27 ENCOUNTER — Other Ambulatory Visit: Payer: Self-pay

## 2017-07-27 ENCOUNTER — Encounter (HOSPITAL_COMMUNITY): Payer: Self-pay | Admitting: *Deleted

## 2017-07-27 NOTE — Progress Notes (Signed)
Pt denies SOB, chest pain, and being under the care of a cardiologist. Pt denies having a stress test and cardiac cath. Pt denies having a chest x ray and EKG within the last year. Pt denies recent labs, Pt made aware to stop taking vitamins and herbal medications. Pt verbalized understanding of all pre-op instructions.

## 2017-07-28 ENCOUNTER — Ambulatory Visit (HOSPITAL_COMMUNITY): Payer: BC Managed Care – PPO | Admitting: Certified Registered Nurse Anesthetist

## 2017-07-28 ENCOUNTER — Ambulatory Visit (HOSPITAL_COMMUNITY)
Admission: RE | Admit: 2017-07-28 | Discharge: 2017-07-28 | Disposition: A | Discharge status: Home / Self Care | Payer: BC Managed Care – PPO | Source: Ambulatory Visit | Attending: Orthopedic Surgery | Admitting: Orthopedic Surgery

## 2017-07-28 ENCOUNTER — Encounter (HOSPITAL_COMMUNITY)
Admission: RE | Disposition: A | Discharge status: Home / Self Care | Payer: Self-pay | Source: Ambulatory Visit | Attending: Orthopedic Surgery

## 2017-07-28 ENCOUNTER — Encounter (HOSPITAL_COMMUNITY): Payer: Self-pay | Admitting: *Deleted

## 2017-07-28 DIAGNOSIS — Z6839 Body mass index (BMI) 39.0-39.9, adult: Secondary | ICD-10-CM | POA: Insufficient documentation

## 2017-07-28 DIAGNOSIS — M5412 Radiculopathy, cervical region: Secondary | ICD-10-CM

## 2017-07-28 DIAGNOSIS — J45909 Unspecified asthma, uncomplicated: Secondary | ICD-10-CM | POA: Diagnosis not present

## 2017-07-28 DIAGNOSIS — Z87891 Personal history of nicotine dependence: Secondary | ICD-10-CM | POA: Diagnosis not present

## 2017-07-28 DIAGNOSIS — M4802 Spinal stenosis, cervical region: Secondary | ICD-10-CM | POA: Insufficient documentation

## 2017-07-28 HISTORY — DX: Cervicalgia: M54.2

## 2017-07-28 HISTORY — DX: Diffuse cystic mastopathy of unspecified breast: N60.19

## 2017-07-28 HISTORY — DX: Attention-deficit hyperactivity disorder, unspecified type: F90.9

## 2017-07-28 HISTORY — DX: Benign neoplasm of connective and other soft tissue, unspecified: D21.9

## 2017-07-28 HISTORY — PX: RADIOLOGY WITH ANESTHESIA: SHX6223

## 2017-07-28 HISTORY — DX: Unspecified osteoarthritis, unspecified site: M19.90

## 2017-07-28 LAB — BASIC METABOLIC PANEL
Anion gap: 9 (ref 5–15)
BUN: 13 mg/dL (ref 6–20)
CHLORIDE: 106 mmol/L (ref 101–111)
CO2: 27 mmol/L (ref 22–32)
CREATININE: 0.5 mg/dL (ref 0.44–1.00)
Calcium: 9.5 mg/dL (ref 8.9–10.3)
GFR calc Af Amer: >60 mL/min (ref >60)
GLUCOSE: 99 mg/dL (ref 65–99)
POTASSIUM: 3.5 mmol/L (ref 3.5–5.1)
Sodium: 142 mmol/L (ref 135–145)

## 2017-07-28 LAB — CBC
HCT: 43.9 % (ref 36.0–46.0)
Hemoglobin: 13.5 g/dL (ref 12.0–15.0)
MCH: 25.6 pg — AB (ref 26.0–34.0)
MCHC: 30.8 g/dL (ref 30.0–36.0)
MCV: 83.3 fL (ref 78.0–100.0)
PLATELETS: 286 10*3/uL (ref 150–400)
RBC: 5.27 MIL/uL — AB (ref 3.87–5.11)
RDW: 14.6 % (ref 11.5–15.5)
WBC: 8.6 10*3/uL (ref 4.0–10.5)

## 2017-07-28 SURGERY — MRI WITH ANESTHESIA
Anesthesia: General

## 2017-07-28 MED ORDER — DEXAMETHASONE SODIUM PHOSPHATE 10 MG/ML IJ SOLN
INTRAMUSCULAR | Status: DC | PRN
  Administered 2017-07-28: 4 mg via INTRAVENOUS

## 2017-07-28 MED ORDER — LIDOCAINE HCL (CARDIAC) PF 100 MG/5ML IV SOSY
PREFILLED_SYRINGE | INTRAVENOUS | Status: DC | PRN
  Administered 2017-07-28: 60 mg via INTRAVENOUS

## 2017-07-28 MED ORDER — MIDAZOLAM HCL 2 MG/2ML IJ SOLN
INTRAMUSCULAR | Status: DC | PRN
  Administered 2017-07-28: 2 mg via INTRAVENOUS

## 2017-07-28 MED ORDER — PROPOFOL 10 MG/ML IV BOLUS
INTRAVENOUS | Status: DC | PRN
  Administered 2017-07-28: 200 mg via INTRAVENOUS

## 2017-07-28 MED ORDER — OXYCODONE HCL 5 MG PO TABS: 5.0000 mg | ORAL_TABLET | Freq: Once | ORAL | Status: DC | PRN

## 2017-07-28 MED ORDER — FENTANYL CITRATE (PF) 100 MCG/2ML IJ SOLN
INTRAMUSCULAR | Status: DC | PRN
  Administered 2017-07-28: 50 ug via INTRAVENOUS

## 2017-07-28 MED ORDER — FENTANYL CITRATE (PF) 100 MCG/2ML IJ SOLN: 25.0000 ug | INTRAMUSCULAR | Status: DC | PRN

## 2017-07-28 MED ORDER — OXYCODONE HCL 5 MG/5ML PO SOLN: 5.0000 mg | Freq: Once | ORAL | Status: DC | PRN

## 2017-07-28 MED ORDER — ONDANSETRON HCL 4 MG/2ML IJ SOLN
INTRAMUSCULAR | Status: DC | PRN
  Administered 2017-07-28: 4 mg via INTRAVENOUS

## 2017-07-28 MED ORDER — LACTATED RINGERS IV SOLN
INTRAVENOUS | Status: DC
  Administered 2017-07-28 (×2): via INTRAVENOUS

## 2017-07-28 MED ORDER — ONDANSETRON HCL 4 MG/2ML IJ SOLN: 4.0000 mg | Freq: Four times a day (QID) | INTRAMUSCULAR | Status: DC | PRN

## 2017-07-28 NOTE — Anesthesia Postprocedure Evaluation (Signed)
Anesthesia Post Note  Patient: Lauren Nicholson  Procedure(s) Performed: MRI OF CERVICAL SPINE WITHOUT CONTRAST (N/A )     Patient location during evaluation: PACU Anesthesia Type: General Level of consciousness: awake and alert Pain management: pain level controlled Vital Signs Assessment: post-procedure vital signs reviewed and stable Respiratory status: spontaneous breathing, nonlabored ventilation, respiratory function stable and patient connected to nasal cannula oxygen Cardiovascular status: blood pressure returned to baseline and stable Postop Assessment: no apparent nausea or vomiting Anesthetic complications: no    Last Vitals:  Vitals:   07/28/17 0633 07/28/17 0926  BP: (!) 146/85 135/88  Pulse: 78 78  Resp: 20 15  Temp: 36.9 C (!) 36.4 C  SpO2: 100% 100%    Last Pain:  Vitals:   07/28/17 0926  PainSc: 0-No pain                 Paiton Boultinghouse S

## 2017-07-28 NOTE — Anesthesia Procedure Notes (Signed)
Procedure Name: LMA Insertion Date/Time: 07/27/2017 8:26 AM Performed by: Inda Coke, CRNA Pre-anesthesia Checklist: Patient identified, Emergency Drugs available, Suction available and Patient being monitored Patient Re-evaluated:Patient Re-evaluated prior to induction Oxygen Delivery Method: Circle System Utilized Preoxygenation: Pre-oxygenation with 100% oxygen Induction Type: IV induction Ventilation: Mask ventilation without difficulty LMA: LMA inserted LMA Size: 4.0 Number of attempts: 1 Airway Equipment and Method: Bite block Placement Confirmation: positive ETCO2 Tube secured with: Tape Dental Injury: Teeth and Oropharynx as per pre-operative assessment

## 2017-07-28 NOTE — Transfer of Care (Addendum)
Immediate Anesthesia Transfer of Care Note  Patient: Lauren Nicholson  Procedure(s) Performed: MRI OF CERVICAL SPINE WITHOUT CONTRAST (N/A )  Patient Location: PACU  Anesthesia Type:General  Level of Consciousness: awake, alert  and oriented  Airway & Oxygen Therapy: Patient Spontanous Breathing and Patient connected to nasal cannula oxygen  Post-op Assessment: Report given to RN, Post -op Vital signs reviewed and stable and Patient moving all extremities X 4  Post vital signs: Reviewed and stable  Last Vitals:  Vitals Value Taken Time  BP 135/88 07/28/2017  9:26 AM  Temp 36.4 C 07/28/2017  9:26 AM  Pulse 83 07/28/2017  9:32 AM  Resp 20 07/28/2017  9:32 AM  SpO2 96 % 07/28/2017  9:32 AM  Vitals shown include unvalidated device data.  Last Pain:  Vitals:   07/28/17 0926  PainSc: 0-No pain      Patients Stated Pain Goal: 9 (16/10/96 0454)  Complications: No apparent anesthesia complications

## 2017-07-28 NOTE — Anesthesia Preprocedure Evaluation (Signed)
Anesthesia Evaluation  Patient identified by MRN, date of birth, ID band Patient awake    Reviewed: Allergy & Precautions, H&P , NPO status , Patient's Chart, lab work & pertinent test results  Airway Mallampati: II   Neck ROM: full    Dental   Pulmonary asthma , former smoker,    breath sounds clear to auscultation       Cardiovascular negative cardio ROS   Rhythm:regular Rate:Normal     Neuro/Psych ADHD   GI/Hepatic   Endo/Other  Morbid obesity  Renal/GU      Musculoskeletal  (+) Arthritis ,   Abdominal   Peds  Hematology   Anesthesia Other Findings   Reproductive/Obstetrics                             Anesthesia Physical Anesthesia Plan  ASA: II  Anesthesia Plan: General   Post-op Pain Management:    Induction: Intravenous  PONV Risk Score and Plan: 3 and Ondansetron, Dexamethasone and Midazolam  Airway Management Planned: LMA  Additional Equipment:   Intra-op Plan:   Post-operative Plan:   Informed Consent: I have reviewed the patients History and Physical, chart, labs and discussed the procedure including the risks, benefits and alternatives for the proposed anesthesia with the patient or authorized representative who has indicated his/her understanding and acceptance.     Plan Discussed with: CRNA, Anesthesiologist and Surgeon  Anesthesia Plan Comments:         Anesthesia Quick Evaluation

## 2017-07-29 ENCOUNTER — Encounter (HOSPITAL_COMMUNITY): Payer: Self-pay | Admitting: Radiology

## 2018-08-19 ENCOUNTER — Other Ambulatory Visit: Payer: Self-pay | Admitting: Family Medicine

## 2018-08-19 DIAGNOSIS — Z9289 Personal history of other medical treatment: Secondary | ICD-10-CM

## 2018-10-05 ENCOUNTER — Other Ambulatory Visit: Payer: Self-pay

## 2018-10-05 ENCOUNTER — Ambulatory Visit
Admission: RE | Admit: 2018-10-05 | Discharge: 2018-10-05 | Disposition: A | Discharge status: Home / Self Care | Payer: BC Managed Care – PPO | Source: Ambulatory Visit | Attending: Family Medicine | Admitting: Family Medicine

## 2018-10-05 DIAGNOSIS — Z9289 Personal history of other medical treatment: Secondary | ICD-10-CM

## 2020-02-08 ENCOUNTER — Telehealth: Payer: Self-pay | Admitting: Family

## 2020-02-08 NOTE — Telephone Encounter (Signed)
Called to Discuss with patient about Covid symptoms and the use of the monoclonal antibody infusion for those with mild to moderate Covid symptoms and at a high risk of hospitalization.     Pt appears to qualify for this infusion due to co-morbid conditions and/or a member of an at-risk group in accordance with the FDA Emergency Use Authorization.    Lauren Nicholson tested positive on 01/31/20.  Qualifying risk factors include BMI >25 (39) and high SVI score.   I spoke with Lauren Nicholson who said she is not interested in receiving treatment at this time. Hotline number provided.   Terri Piedra, NP 02/08/2020 2:25 PM

## 2020-08-01 ENCOUNTER — Other Ambulatory Visit: Payer: Self-pay | Admitting: Internal Medicine

## 2020-08-01 DIAGNOSIS — Z1231 Encounter for screening mammogram for malignant neoplasm of breast: Secondary | ICD-10-CM

## 2020-08-04 ENCOUNTER — Ambulatory Visit
Admission: RE | Admit: 2020-08-04 | Discharge: 2020-08-04 | Disposition: A | Discharge status: Home / Self Care | Payer: BC Managed Care – PPO | Source: Ambulatory Visit | Attending: Internal Medicine | Admitting: Internal Medicine

## 2020-08-04 DIAGNOSIS — Z1231 Encounter for screening mammogram for malignant neoplasm of breast: Secondary | ICD-10-CM

## 2021-06-21 ENCOUNTER — Other Ambulatory Visit: Payer: Self-pay | Admitting: Internal Medicine

## 2021-06-21 DIAGNOSIS — Z1231 Encounter for screening mammogram for malignant neoplasm of breast: Secondary | ICD-10-CM

## 2021-08-05 ENCOUNTER — Other Ambulatory Visit: Payer: Self-pay | Admitting: Internal Medicine

## 2021-08-05 DIAGNOSIS — Z8249 Family history of ischemic heart disease and other diseases of the circulatory system: Secondary | ICD-10-CM

## 2021-08-09 ENCOUNTER — Ambulatory Visit: Payer: BC Managed Care – PPO

## 2021-08-09 ENCOUNTER — Ambulatory Visit
Admission: RE | Admit: 2021-08-09 | Discharge: 2021-08-09 | Disposition: A | Discharge status: Home / Self Care | Payer: BC Managed Care – PPO | Source: Ambulatory Visit | Attending: Internal Medicine | Admitting: Internal Medicine

## 2021-08-09 DIAGNOSIS — Z1231 Encounter for screening mammogram for malignant neoplasm of breast: Secondary | ICD-10-CM

## 2021-10-24 ENCOUNTER — Ambulatory Visit
Admission: RE | Admit: 2021-10-24 | Discharge: 2021-10-24 | Disposition: A | Discharge status: Home / Self Care | Payer: BC Managed Care – PPO | Source: Ambulatory Visit | Attending: Internal Medicine | Admitting: Internal Medicine

## 2021-10-24 ENCOUNTER — Encounter: Payer: Self-pay | Admitting: Internal Medicine

## 2021-10-24 DIAGNOSIS — Z8249 Family history of ischemic heart disease and other diseases of the circulatory system: Secondary | ICD-10-CM

## 2022-04-28 ENCOUNTER — Emergency Department (HOSPITAL_COMMUNITY): Payer: Medicaid Other

## 2022-04-28 ENCOUNTER — Emergency Department (HOSPITAL_BASED_OUTPATIENT_CLINIC_OR_DEPARTMENT_OTHER): Admit: 2022-04-28 | Discharge: 2022-04-28 | Disposition: A | Discharge status: Home / Self Care | Payer: Medicaid Other

## 2022-04-28 ENCOUNTER — Encounter (HOSPITAL_COMMUNITY): Payer: Self-pay

## 2022-04-28 ENCOUNTER — Emergency Department (HOSPITAL_COMMUNITY)
Admission: EM | Admit: 2022-04-28 | Discharge: 2022-04-28 | Disposition: A | Discharge status: Home / Self Care | Payer: Medicaid Other | Attending: Emergency Medicine | Admitting: Emergency Medicine

## 2022-04-28 DIAGNOSIS — M1712 Unilateral primary osteoarthritis, left knee: Secondary | ICD-10-CM | POA: Diagnosis not present

## 2022-04-28 DIAGNOSIS — M25562 Pain in left knee: Secondary | ICD-10-CM | POA: Diagnosis present

## 2022-04-28 DIAGNOSIS — M7989 Other specified soft tissue disorders: Secondary | ICD-10-CM | POA: Diagnosis not present

## 2022-04-28 MED ORDER — OXYCODONE-ACETAMINOPHEN 5-325 MG PO TABS
2.0000 | ORAL_TABLET | Freq: Once | ORAL | Status: AC
  Administered 2022-04-28: 2 via ORAL
  Filled 2022-04-28: qty 2

## 2022-04-28 MED ORDER — IBUPROFEN 600 MG PO TABS: 600.0000 mg | ORAL_TABLET | Freq: Three times a day (TID) | ORAL | 0 refills | Status: AC | PRN

## 2022-04-28 NOTE — ED Provider Notes (Signed)
[facility] Provider Note   CSN: [id number] Arrival date & time: 04/28/22  1649     History {Add pertinent medical, surgical, social history, OB history to HPI:1} Chief Complaint  Patient presents with   Knee Pain    CAI Lauren Nicholson is a 62 y.o. female who presents to the ED complaining of left knee pain and swelling that started last night.  She reports that she has a known history of osteoarthritis in the left knee for which she was followed by orthopedics and has received joint injections and also been recommended for a left knee replacement.  She reports that she is unable to undergo left knee replacement she lives in second story residence and has no assistance at home for the physical therapy that we will follow the procedure.  She denies any fall or injury to the knee.  She states that she was sitting down when she suddenly noticed the symptoms.  She has never completed physical therapy for her left knee pain in the past.  She denies chest pain or shortness of breath.  She denies recent travel, recent surgery, hormone use, history of DVT/PE, or other complaints today.  She has been taking Advil at home for the pain without relief of her symptoms.  States that she is having difficulty with weightbearing and ambulation secondary to the pain and swelling.      Home Medications Prior to Admission medications   Medication Sig Start Date End Date Taking? Authorizing Provider  ibuprofen (ADVIL) 600 MG tablet Take 1 tablet (600 mg total) by mouth every 8 (eight) hours as needed for up to 3 days for mild pain or moderate pain. 04/28/22 05/01/22 Yes Lakitha Gordy L, PA-C  albuterol (PROVENTIL HFA;VENTOLIN HFA) 108 (90 BASE) MCG/ACT inhaler Inhale 2 puffs into the lungs every 4 (four) hours as needed for wheezing or shortness of breath. Patient not taking: Reported on 07/21/2017 01/06/15   Waynetta Pean, PA-C  lidocaine (LIDODERM) 5 % Place 1 patch onto  the skin daily. Remove & Discard patch within 12 hours or as directed by MD Patient not taking: Reported on 07/21/2017 03/18/17   Ocie Cornfield T, PA-C  methocarbamol (ROBAXIN) 500 MG tablet Take 1 tablet (500 mg total) by mouth 2 (two) times daily. Patient not taking: Reported on 07/21/2017 06/15/17   Barnet Glasgow, NP  methylPREDNISolone (MEDROL DOSEPAK) 4 MG TBPK tablet Day 1: 8 mg PO before breakfast, 4 mg after lunch and after dinner, and 8 mg at bedtime  Day 2: 4 mg PO before breakfast, after lunch, and after dinner and 8 mg at bedtime  Day 3: 4 mg PO before breakfast, after lunch, after dinner, and at bedtime  Day 4: 4 mg PO before breakfast, after lunch, and at bedtime  Day 5: 4 mg PO before breakfast and at bedtime  Day 6: 4 mg PO before breakfast Patient not taking: Reported on 07/21/2017 06/29/17   Jillyn Ledger, PA-C  montelukast (SINGULAIR) 10 MG tablet Take 10 mg by mouth at bedtime.    [provider]  OVER THE COUNTER MEDICATION Apply 1 application topically daily as needed (knee pain). CBD Cream    [provider]  predniSONE (DELTASONE) 50 MG tablet Take 1 tablet daily with food Patient not taking: Reported on 07/21/2017 06/15/17   Barnet Glasgow, NP      Allergies    Etodolac and Ciprofloxacin    Review of Systems   Review of  Systems  All other systems reviewed and are negative.   Physical Exam Updated Vital Signs BP (!) 149/96 (BP Location: Right Wrist)   Pulse 87   Temp 99.5 F (37.5 C) (Oral)   Resp 18   Ht '4\' 11"'$  (1.499 m)   Wt 96.2 kg   SpO2 96%   BMI 42.82 kg/m  Physical Exam Vitals and nursing note reviewed.  Constitutional:      General: She is not in acute distress.    Appearance: Normal appearance. She is not ill-appearing, toxic-appearing or diaphoretic.  HENT:     Head: Normocephalic and atraumatic.     Mouth/Throat:     Mouth: Mucous membranes are moist.  Eyes:     General: No scleral icterus.    Extraocular  Movements: Extraocular movements intact.     Conjunctiva/sclera: Conjunctivae normal.  Cardiovascular:     Rate and Rhythm: Normal rate and regular rhythm.     Heart sounds: No murmur heard. Pulmonary:     Effort: Pulmonary effort is normal. No respiratory distress.     Breath sounds: Normal breath sounds. No stridor. No wheezing, rhonchi or rales.  Abdominal:     General: Abdomen is flat.     Palpations: Abdomen is soft.     Tenderness: There is no abdominal tenderness.  Musculoskeletal:     Cervical back: Normal range of motion and neck supple. No rigidity.     Comments: Diffuse tenderness and swelling over the left anterior knee, difficult to localize a point of tenderness, no overlying erythema, ecchymosis, abrasion, laceration, wounds or other skin changes, no posterior knee tenderness, no calf tenderness, 2+ PT and DP pulses, soft compartments, range of motion limited secondary to pain and swelling  Skin:    General: Skin is warm and dry.     Capillary Refill: Capillary refill takes less than 2 seconds.  Neurological:     Mental Status: She is alert. Mental status is at baseline.  Psychiatric:        Behavior: Behavior normal.     ED Results / Procedures / Treatments   Labs (all labs ordered are listed, but only abnormal results are displayed) Labs Reviewed - No data to display  EKG None  Radiology VAS [place] LOWER EXTREMITY VENOUS (DVT) (ONLY [facility] & [facility])  Result Date: 04/28/2022  Lower Venous DVT Study Patient Name:  MARGERET SLIVA  Date of Exam:   04/28/2022 Medical Rec #: [id number]       Accession #:    [id number] Date of Birth: 1960/05/02       Patient Gender: F Patient Age:   60 years Exam Location:  [facility] Procedure:      VAS [place] LOWER EXTREMITY VENOUS (DVT) Referring Phys: [facility] Samarie Pinder --------------------------------------------------------------------------------  Indications: Swelling.  Risk Factors: None identified. Limitations: Body habitus, poor  ultrasound/tissue interface and patient positioning. Comparison Study: No prior studies. Performing Technologist: Oliver Hum RVT  Examination Guidelines: A complete evaluation includes B-mode imaging, spectral Doppler, color Doppler, and power Doppler as needed of all accessible portions of each vessel. Bilateral testing is considered an integral part of a complete examination. Limited examinations for reoccurring indications may be performed as noted. The reflux portion of the exam is performed with the patient in reverse Trendelenburg.  +-----+---------------+---------+-----------+----------+--------------+ RIGHTCompressibilityPhasicitySpontaneityPropertiesThrombus Aging +-----+---------------+---------+-----------+----------+--------------+ CFV  Full           Yes      Yes                                 +-----+---------------+---------+-----------+----------+--------------+   +---------+---------------+---------+-----------+----------+-------------------+  LEFT     CompressibilityPhasicitySpontaneityPropertiesThrombus Aging      +---------+---------------+---------+-----------+----------+-------------------+ CFV      Full           Yes      Yes                                      +---------+---------------+---------+-----------+----------+-------------------+ SFJ      Full                                                             +---------+---------------+---------+-----------+----------+-------------------+ FV Prox  Full                                                             +---------+---------------+---------+-----------+----------+-------------------+ FV Mid   Full                                                             +---------+---------------+---------+-----------+----------+-------------------+ FV Distal               Yes      Yes                                       +---------+---------------+---------+-----------+----------+-------------------+ PFV      Full                                                             +---------+---------------+---------+-----------+----------+-------------------+ POP      Full           Yes      Yes                                      +---------+---------------+---------+-----------+----------+-------------------+ PTV      Full                                                             +---------+---------------+---------+-----------+----------+-------------------+ PERO                                                  Not well visualized +---------+---------------+---------+-----------+----------+-------------------+    Summary: RIGHT: - No evidence of common femoral vein  obstruction.  LEFT: - There is no evidence of deep vein thrombosis in the lower extremity. However, portions of this examination were limited- see technologist comments above.  - No cystic structure found in the popliteal fossa.  *See table(s) above for measurements and observations. Electronically signed by Servando Snare MD on 04/28/2022 at 7:28:10 PM.    Final    DG Knee Complete 4 Views Left  Result Date: 04/28/2022 CLINICAL DATA:  Knee pain and swelling EXAM: LEFT KNEE - COMPLETE 4+ VIEW COMPARISON:  None Available. FINDINGS: Left knee osteoarthritis most pronounced in the medial compartment with joint space loss, sclerosis and bony spurring. Normal alignment without acute osseous finding, fracture or effusion. IMPRESSION: Left knee osteoarthritis, most pronounced in the medial compartment. Electronically Signed   By: Jerilynn Mages.  Shick M.D.   On: 04/28/2022 18:25    Procedures Procedures  {Document cardiac monitor, telemetry assessment procedure when appropriate:1}  Medications Ordered in ED Medications  oxyCODONE-acetaminophen (PERCOCET/ROXICET) 5-325 MG per tablet 2 tablet (2 tablets Oral Given 04/28/22 1747)    ED Course/ Medical Decision  Making/ A&P   {   Click here for ABCD2, HEART and other calculatorsREFRESH Note before signing :1}                          Medical Decision Making Amount and/or Complexity of Data Reviewed Radiology: ordered. Decision-making details documented in ED Course.  Risk Prescription drug management.   ***  {Document critical care time when appropriate:1} {Document review of labs and clinical decision tools ie heart score, Chads2Vasc2 etc:1}  {Document your independent review of radiology images, and any outside records:1} {Document your discussion with family members, caretakers, and with consultants:1} {Document social determinants of health affecting pt's care:1} {Document your decision making why or why not admission, treatments were needed:1} Final Clinical Impression(s) / ED Diagnoses Final diagnoses:  Primary osteoarthritis of left knee    Rx / DC Orders ED Discharge Orders          Ordered    ibuprofen (ADVIL) 600 MG tablet  Every 8 hours PRN        04/28/22 1931

## 2022-04-28 NOTE — ED Notes (Signed)
Paged VAS Tech at 18:09

## 2022-04-28 NOTE — ED Triage Notes (Signed)
Pt to ED c/o left knee pain and swelling that started  last night, no known injury, walk with can at baseline, reports hx osteoarthritis.

## 2022-04-28 NOTE — Discharge Instructions (Addendum)
Thank you for letting us take care of you today.  The x-ray of your knee showed some osteoarthritis which you mentioned we knew about having previously.  It did not show any breaks or other issues with the bones that appear new. The ultrasound did not show a blood clot.  Unfortunately, it seems as if your pain is due to your chronic arthritis in your knee.  I know they have previously recommended a knee replacement.  I provided the information for our on-call orthopedic doctor who you may follow-up with for a second opinion if you desire to do so.  I also provided information for 2 primary care clinics that you may contact to establish primary care if you do not already have a PCP that you follow-up with.  They may also have recommendations for treating your chronic pain including referring you to physical therapy.  I am providing you with crutches to use as needed to assist with walking.  I recommend only using these as absolutely necessary to get around over the next few days.  If you develop any new or worsening symptoms such as a new injury to the knee, numbness, tingling, weakness in the leg, fever, or any other new, concerning symptoms, please return to the nearest emergency department for reevaluation.

## 2022-04-28 NOTE — ED Notes (Signed)
Paged VAS Tech at 17:51

## 2022-04-28 NOTE — Progress Notes (Signed)
Left lower extremity venous duplex has been completed. Preliminary results can be found in CV Proc through chart review.  Results were given to [place] PA.  04/28/22 7:18 PM Lauren Nicholson RVT

## 2022-05-14 ENCOUNTER — Ambulatory Visit
Admission: EM | Admit: 2022-05-14 | Discharge: 2022-05-14 | Disposition: A | Discharge status: Home / Self Care | Payer: Medicaid Other | Attending: Family Medicine | Admitting: Family Medicine

## 2022-05-14 DIAGNOSIS — Z0130 Encounter for examination of blood pressure without abnormal findings: Secondary | ICD-10-CM

## 2022-05-14 NOTE — ED Provider Notes (Signed)
[facility]   [id number] 05/14/22 Arrival Time: [id number]  ASSESSMENT & PLAN:  1. Blood pressure check    BP normal here. Has f/u next week to recheck BP. No symptoms. Reassured.    Follow-up Information     Kelton Pillar, MD.   Specialty: Family Medicine Why: Keep your upcoming appointment. Contact information: [address] [place] [address] [phone]                 Reviewed expectations re: course of current medical issues. Questions answered. Outlined signs and symptoms indicating need for more acute intervention. Patient verbalized understanding. After Visit Summary given.   SUBJECTIVE:  Lauren Nicholson is a 62 y.o. female who presents with concerns regarding increased blood pressures measured at home; specifically diastolic around [id number]. No symptoms. No previous dx of HTN. Denies symptoms of chest pain, palpations, orthopnea, nocturnal dyspnea, or LE edema.  Social History   Tobacco Use  Smoking Status Former   Types: Cigarettes   Quit date: 03/27/2017   Years since quitting: 5.1  Smokeless Tobacco Never    OBJECTIVE:  Vitals:   05/14/22 1058  BP: 132/81  Pulse: 80  Resp: 18  Temp: 98.5 F (36.9 C)  TempSrc: Oral  SpO2: 96%    General appearance: alert; no distress Extremities: no edema; symmetrical with no gross deformities Skin: warm and dry Psychological: alert and cooperative; normal mood and affect  Labs Reviewed - No data to display  Imaging: No results found.  Allergies  Allergen Reactions   Etodolac Diarrhea and Nausea And Vomiting   Ciprofloxacin Rash    Past Medical History:  Diagnosis Date   ADHD    Asthma    Diverticulitis    Fibrocystic breast    Fibroids    uterine fibroids with cyst   Neck pain    radiates to LUE   OA (osteoarthritis)    B/L knees   Social History   Socioeconomic History   Marital status: Married    Spouse name: Not on file   Number of children: Not on  file   Years of education: Not on file   Highest education level: Not on file  Occupational History   Not on file  Tobacco Use   Smoking status: Former    Types: Cigarettes    Quit date: 03/27/2017    Years since quitting: 5.1   Smokeless tobacco: Never  Vaping Use   Vaping Use: Never used  Substance and Sexual Activity   Alcohol use: No   Drug use: No   Sexual activity: Not on file  Other Topics Concern   Not on file  Social History Narrative   Not on file   Social Determinants of Health   Financial Resource Strain: Not on file  Food Insecurity: Not on file  Transportation Needs: Not on file  Physical Activity: Not on file  Stress: Not on file  Social Connections: Not on file  Intimate Partner Violence: Not on file   Family History  Problem Relation Age of Onset   Cancer Mother    Heart disease Father    Breast cancer Half-Sister    Past Surgical History:  Procedure Laterality Date   COLONOSCOPY     RADIOLOGY WITH ANESTHESIA N/A 07/28/2017   Procedure: MRI OF CERVICAL SPINE WITHOUT CONTRAST;  Surgeon: Radiologist, Medication, MD;  Location: [facility];  Service: Radiology;  Laterality: N/AVanessa Kick, MD 05/14/22 1132

## 2022-05-14 NOTE — ED Triage Notes (Signed)
Pt presents for blood pressure check; pt states last few times she has checked he rblood pressure has been elevated.

## 2022-05-14 NOTE — Discharge Instructions (Signed)
BP 132/81 (BP Location: Left Arm)   Pulse 80   Temp 98.5 F (36.9 C) (Oral)   Resp 18   SpO2 96%

## 2022-05-19 ENCOUNTER — Encounter: Payer: Self-pay | Admitting: Student

## 2022-05-19 ENCOUNTER — Ambulatory Visit: Payer: Medicaid Other | Admitting: Student

## 2022-05-19 VITALS — BP 136/89 | HR 88 | Ht 59.0 in | Wt 218.0 lb

## 2022-05-19 DIAGNOSIS — R519 Headache, unspecified: Secondary | ICD-10-CM

## 2022-05-19 DIAGNOSIS — E7800 Pure hypercholesterolemia, unspecified: Secondary | ICD-10-CM | POA: Diagnosis not present

## 2022-05-19 DIAGNOSIS — M170 Bilateral primary osteoarthritis of knee: Secondary | ICD-10-CM | POA: Diagnosis not present

## 2022-05-19 NOTE — Patient Instructions (Addendum)
It was great to see you today! Thank you for choosing [facility] for your primary care. Lauren Nicholson was seen for .  Today we addressed: I have referred you to cardiology and ophthalmology   Dr. Rip Harbour Address: [address], [place], [place] [address] Phone: [phone]  2. I have scheduled you with Dr. Valentina Lucks for assistance with blood pressure  If you haven't already, sign up for My Chart to have easy access to your labs results, and communication with your primary care physician.  I recommend that you always bring your medications to each appointment as this makes it easy to ensure you are on the correct medications and helps [place] not miss refills when you need them. Call the clinic at [phone] if your symptoms worsen or you have any concerns.  You should return to our clinic Return in about 2 months (around 07/19/2022). Please arrive 15 minutes before your appointment to ensure smooth check in process.  We appreciate your efforts in making this happen.  Thank you for allowing me to participate in your care, Erskine Emery, MD 05/19/2022, 2:34 PM PGY-2, [place]

## 2022-05-19 NOTE — Progress Notes (Unsigned)
   Subjective:  Patient ID: Lauren Nicholson, female    DOB: [birth year], 62 y.o.   MRN: [id number]  CC: New Patient  HPI:  Lauren Nicholson is a very pleasant 62 y.o. female who presents today to establish care.  Cardiac History  -Has family history of heart disease, mother passed away of Vfib at 20, sister died of MI 77s. Cannot get her coronary calcium scan, has claustrophobia.  -She notes that she did receive valium for the test but could not go through with it  -History of elevated lipid panel, LDL   Bilateral knee pain: Has been getting injections in the knees for her OA  Uses a cane for ambulation  Feels that the injections have not been beneficial with the placement of the injection  Bilateral Knee XR  IMPRESSION: Left knee osteoarthritis, most pronounced in the medial compartment.    Has history of ocular migraines  Has seen Dr. Gershon Crane in the past, drinks water with the instances or cuts the lights for relief  Is concerned about this, has family history of congenital blindness    PMHx: Past Medical History:  Diagnosis Date   ADHD    Asthma    Diverticulitis    Fibrocystic breast    Fibroids    uterine fibroids with cyst   Neck pain    radiates to LUE   OA (osteoarthritis)    B/L knees    Surgical Hx: Past Surgical History:  Procedure Laterality Date   COLONOSCOPY     RADIOLOGY WITH ANESTHESIA N/A 07/28/2017   Procedure: MRI OF CERVICAL SPINE WITHOUT CONTRAST;  Surgeon: Radiologist, Medication, MD;  Location: [facility];  Service: Radiology;  Laterality: N/A;    Family Hx: Family History  Problem Relation Age of Onset   Cancer Mother    Heart disease Father    Breast cancer Half-Sister     Social Hx: Current Social History   Reliable transportation No concerns at home with her husband, feels safe    Medications:  Preventative Screening Colonoscopy was in 2014  HIV and Hep C screening  [facility] performed pap smear, HPV + per patient  October 2023 pap  smear  Smoking status reviewed and negative without alcohol use   ROS: pertinent noted in the HPI   Objective:  BP (!) 142/98   Pulse 88   Ht 4\' 11"  (1.499 m)   Wt 218 lb (98.9 kg)   SpO2 98%   BMI 44.03 kg/m  Vitals and nursing note reviewed  General: NAD, pleasant, able to participate in exam HEENT: normocephalic, TM's visualized bilaterally, no scleral icterus or conjunctival pallor, no nasal discharge, moist mucous membranes, good dentition without erythema or discharge noted in posterior oropharynx Neck: supple, non-tender, without lymphadenopathy Cardiac: RRR, S1 S2 present. normal heart sounds, no murmurs. Respiratory: CTAB, normal effort, No wheezes, rales or rhonchi Abdomen: Normoactive bowel sounds, non-tender, non-distended, no hepatosplenomegaly Extremities: no edema or cyanosis. Skin: warm and dry, no rashes noted Neuro: alert, no obvious focal deficits Psych: Normal affect and mood  Assessment & Plan:  Hypercholesterolemia -     Ambulatory referral to Cardiology  Ocular headache -     Ambulatory referral to Ophthalmology   No follow-ups on file. Erskine Emery, MD 05/19/2022, 2:33 PM PGY-2, [place]

## 2022-05-20 DIAGNOSIS — M170 Bilateral primary osteoarthritis of knee: Secondary | ICD-10-CM | POA: Insufficient documentation

## 2022-05-20 DIAGNOSIS — R519 Headache, unspecified: Secondary | ICD-10-CM | POA: Insufficient documentation

## 2022-05-20 DIAGNOSIS — E7800 Pure hypercholesterolemia, unspecified: Secondary | ICD-10-CM | POA: Insufficient documentation

## 2022-05-20 NOTE — Assessment & Plan Note (Signed)
Requests referral to Dr. Gershon Crane for her annual eye exams. Sent referral on request

## 2022-05-20 NOTE — Assessment & Plan Note (Signed)
Strong familial cardiac history Card ref sent, will need a coronary calcium score but may not tolerate this H/o elevated cholesterol, unable to see labs as patient was a [facility] patient  Obtained signed consent of ROI for the [place] office visits and labwork.

## 2022-05-20 NOTE — Assessment & Plan Note (Signed)
Uses cane, bilateral OA receiving steroid injections intermittently. Patient stable at this time. Can follow with Rip Harbour. Will address other treatment options during next visit.

## 2022-05-28 ENCOUNTER — Ambulatory Visit: Payer: Medicaid Other | Admitting: Student

## 2022-05-28 ENCOUNTER — Telehealth: Payer: Self-pay

## 2022-05-28 VITALS — BP 137/73 | HR 84 | Ht 59.0 in | Wt 225.8 lb

## 2022-05-28 DIAGNOSIS — M170 Bilateral primary osteoarthritis of knee: Secondary | ICD-10-CM

## 2022-05-28 DIAGNOSIS — J45909 Unspecified asthma, uncomplicated: Secondary | ICD-10-CM | POA: Diagnosis not present

## 2022-05-28 DIAGNOSIS — G8929 Other chronic pain: Secondary | ICD-10-CM

## 2022-05-28 MED ORDER — DULERA 200-5 MCG/ACT IN AERO: 1.0000 | INHALATION_SPRAY | Freq: Two times a day (BID) | RESPIRATORY_TRACT | 3 refills | Status: AC

## 2022-05-28 NOTE — Patient Instructions (Addendum)
It was great to see you today! Thank you for choosing [facility] for your primary care. Lauren Nicholson was seen for .  Today we addressed: Asthma--Dulera 1 puff twice daily + albuterol as needed  Refer to orthopedics and physical therapy, they will call you  You CAN do singulair and zyrtec! Continue daily  Chalazion -- keep using warm compresses   If you haven't already, sign up for My Chart to have easy access to your labs results, and communication with your primary care physician.  I recommend that you always bring your medications to each appointment as this makes it easy to ensure you are on the correct medications and helps [place] not miss refills when you need them. Call the clinic at [phone] if your symptoms worsen or you have any concerns.  You should return to our clinic Return in about 4 weeks (around 06/25/2022). Please arrive 15 minutes before your appointment to ensure smooth check in process.  We appreciate your efforts in making this happen.  Thank you for allowing me to participate in your care, Erskine Emery, MD 05/28/2022, 11:38 AM PGY-2, [place]

## 2022-05-28 NOTE — Progress Notes (Signed)
  SUBJECTIVE:   CHIEF COMPLAINT / HPI:   Cough  Allergies:  Has worsening allergies during this season. Does not like taking Flonase or any types of nasal sprays. Taking Zyrtec and Montelukast together.   Asthma The patient has been diagnosed with asthma in the past and notes that her albuterol has not been assisting with her worsening symptoms. Symptoms have previously included non-productive cough and wheezing. Associated symptoms include nasal congestion.  Suspected precipitants include  time of year .  Symptoms have been gradually worsening since their onset. She notes that the cough awakens her at night and has been interfering with daily life. The patient reports adherence to this regimen  Bilateral Knee Pain Discussed on last visit. She has significant OA, requires a cane with ambulation. Used to go to [employer] and enjoyed this/felt benefit. Still needs to see Dr. Rip Harbour for steroid injections.   PERTINENT  PMH / PSH:  Ocular headaches, HLD, OA of knee, Asthma    Patient Care Team: Erskine Emery, MD as PCP - General (Family Medicine) OBJECTIVE:  BP 137/73   Pulse 84   Ht 4\' 11"  (1.499 m)   Wt 225 lb 12.8 oz (102.4 kg)   SpO2 100%   BMI 45.61 kg/m  Physical Exam  General: Alert and oriented in no apparent distress Heart: Regular rate and rhythm with no murmurs appreciated Lungs: slightly diminished throughout Abdomen: Bowel sounds present, no abdominal pain Skin: Warm and dry Extremities: No lower extremity edema Knee: medial TTP with slight swelling bilaterally. Full flexion and extension with pain at extremes. Crepitus noted. No ligamentous laxity.    ASSESSMENT/PLAN:  Bilateral primary osteoarthritis of knee Assessment & Plan: Orthopedic referral to Dr. Rip Harbour for injections, Aquatherapy via PT at [facility] ordered. Return at next scheduled visit.   Orders: -     Ambulatory referral to Orthopedic Surgery -     Ambulatory referral to Physical  Therapy  Moderate asthma without complication, unspecified whether persistent Assessment & Plan: Rx for [facility] given 1 puff BID for daily therapy of moderate persistent asthma given symptom presentation. Will re-assess breathing at next visit, discussed strict return precautions, no infectious symptoms noted.      Orders: Ruthe Mannan; Inhale 1 puff into the lungs in the morning and at bedtime.  Dispense: 1 each; Refill: 3  Return in about 4 weeks (around 06/25/2022).   Can continue with Zyrtec and Montelukast, side effects discussed.   Erskine Emery, MD 05/29/2022, 4:02 PM PGY-2, [place]

## 2022-05-28 NOTE — Progress Notes (Unsigned)
  SUBJECTIVE:   CHIEF COMPLAINT / HPI:   Cough  Allergies    PERTINENT  PMH / PSH:   Past Medical History:  Diagnosis Date   ADHD    Asthma    Diverticulitis    Fibrocystic breast    Fibroids    uterine fibroids with cyst   Neck pain    radiates to LUE   OA (osteoarthritis)    B/L knees    Patient Care Team: Erskine Emery, MD as PCP - General (Family Medicine) OBJECTIVE:  There were no vitals taken for this visit. Physical Exam   ASSESSMENT/PLAN:  There are no diagnoses linked to this encounter. No follow-ups on file. Erskine Emery, MD 05/28/2022, 8:11 AM PGY-2, [place] {    This will disappear when note is signed, click to select method of visit    :1}

## 2022-05-28 NOTE — Telephone Encounter (Signed)
Patient calls nurse line requesting steroid and antibiotic for cough with wheezing and allergy symptoms.   She reports that for the last several days she has been experiencing issues with wheezing from being outside.   She is able to speak in complete sentences. Advised that patient receive evaluation today. Scheduled with provider this AM.   Discussed ED precautions in the meantime.   Talbot Grumbling, RN

## 2022-05-29 ENCOUNTER — Encounter: Payer: Self-pay | Admitting: Student

## 2022-05-29 NOTE — Assessment & Plan Note (Addendum)
Rx for [facility] given 1 puff BID for daily therapy of moderate persistent asthma given symptom presentation. Will re-assess breathing at next visit, discussed strict return precautions, no infectious symptoms noted.

## 2022-05-29 NOTE — Assessment & Plan Note (Signed)
Orthopedic referral to Dr. Rip Harbour for injections, Aquatherapy via PT at [facility] ordered. Return at next scheduled visit.

## 2022-06-03 ENCOUNTER — Ambulatory Visit: Payer: Medicaid Other | Admitting: Pharmacist

## 2022-06-03 ENCOUNTER — Encounter: Payer: Self-pay | Admitting: Student

## 2022-06-03 VITALS — BP 180/102 | Wt 225.8 lb

## 2022-06-03 DIAGNOSIS — R030 Elevated blood-pressure reading, without diagnosis of hypertension: Secondary | ICD-10-CM | POA: Diagnosis not present

## 2022-06-03 NOTE — Patient Instructions (Signed)
We enjoyed your visit today!  Follow up with [place] in clinic tomorrow to go over the results of your blood pressure readings.   Blood Pressure Activity Diary Time Lying down/ Sleeping Walking/ Exercise Stressed/ Angry Headache/ Pain Dizzy  9 AM       10 AM       11 AM       12 PM       1 PM       2 PM       Time Lying down/ Sleeping Walking/ Exercise Stressed/ Angry Headache/ Pain Dizzy  3 PM       4 PM        5 PM       6 PM       7 PM       8 PM       Time Lying down/ Sleeping Walking/ Exercise Stressed/ Angry Headache/ Pain Dizzy  9 PM       10 PM       11 PM       12 AM       1 AM       2 AM       3 AM       Time Lying down/ Sleeping Walking/ Exercise Stressed/ Angry Headache/ Pain Dizzy  4 AM       5 AM       6 AM       7 AM       8 AM       9 AM       10 AM        Time you woke up: _________                  Time you went to sleep:__________  Come back tomorrow at 8:30 AM to have the monitor removed Call the [facility] if you have any questions before then ([phone])  Wearing the Blood Pressure Monitor The cuff will inflate every 20 minutes during the day and every 30 minutes while you sleep. Your blood pressure readings will NOT display after cuff inflation Fill out the blood pressure-activity diary during the day, especially during activities that may affect your reading -- such as exercise, stress, walking, taking your blood pressure medications  Important things to know: Avoid taking the monitor off for the next 24 hours, unless it causes you discomfort or pain. Do NOT get the monitor wet and do NOT dry to clean the monitor with any cleaning products. Do NOT put the monitor on anyone else's arm. When the cuff inflates, avoid excess movement. Let the cuffed arm hang loosely, slightly away from the body. Avoid flexing the muscles or moving the hand/fingers. When you go to sleep, make sure that the hose is not kinked. Remember to fill out the  blood pressure activity diary. If you experience severe pain or unusual pain (not associated with getting your blood pressure checked), remove the monitor.  Troubleshooting:  Code  Troubleshooting   1  Check cuff position, tighten cuff   2, 3  Remain still during reading   4, 87  Check air hose connections and make sure cuff is tight   85, 89  Check hose connections and make tubing is not crimped   86  Push START/STOP to restart reading   88, 91  Retry by pushing START/STOP   90  Replace batteries. If problem persists, remove monitor and  bring back to   clinic at follow up   97, 98, 99  Service required - Remove monitor and bring back to clinic at follow up

## 2022-06-03 NOTE — Progress Notes (Signed)
    S:     Chief Complaint  Patient presents with   Medication Management    Ambulatory BP   62 y.o. female who presents for hypertension evaluation, education, and management.  PMH is significant for osteoarthritis.  Patient was referred and last seen by Primary Care Provider, Dr. Jena Gauss, on 05/28/22.   Patient presenting today for ambulatory BP referral.   Medication compliance is reported to be good.  Discussed procedure for wearing the monitor and gave patient written instructions. Monitor was placed on non-dominant arm with instructions to return in the morning. Patient reports going to bed at 7 PM and waking up at 4 AM.   Current BP Medications include:  None  Antihypertensives tried in the past include: None    O:  Review of Systems  All other systems reviewed and are negative.   Physical Exam Constitutional:      Appearance: Normal appearance.  Pulmonary:     Effort: Pulmonary effort is normal.  Musculoskeletal:        General: Normal range of motion.  Neurological:     Mental Status: She is alert and oriented to person, place, and time. Mental status is at baseline.  Psychiatric:        Mood and Affect: Mood normal.        Behavior: Behavior normal.        Thought Content: Thought content normal.        Judgment: Judgment normal.     Last 3 Office BP readings: BP Readings from Last 3 Encounters:  06/03/22 (!) 180/102  05/28/22 137/73  05/19/22 136/89    Clinical Atherosclerotic Cardiovascular Disease (ASCVD): No  The ASCVD Risk score (Arnett DK, et al., 2019) failed to calculate for the following reasons:   Cannot find a previous HDL lab   Cannot find a previous total cholesterol lab  Basic Metabolic Panel    Component Value Date/Time   NA 142 07/28/2017 0651   K 3.5 07/28/2017 0651   CL 106 07/28/2017 0651   CO2 27 07/28/2017 0651   GLUCOSE 99 07/28/2017 0651   BUN 13 07/28/2017 0651   CREATININE 0.50 07/28/2017 0651   CALCIUM 9.5  07/28/2017 0651   GFRNONAA >60 07/28/2017 0651   GFRAA >60 07/28/2017 0651    For Office Goal Goal BP of <130/80:  ABPM thresholds: Overall BP < 125/75, daytime BP <130/80 mmHg, sleeptime BP <110/65 mmHg     A/P: History of high blood pressure not on any medications with goal presssure of <130/80. Today's initial amb blood pressure readings was elevated at 180/102.  - Reviewed process of wearing monitor for 24 hours and returning to clinic tomorrow to review results. Written patient instructions provided. Patient verbalized understanding of treatment plan.  Total time in face to face counseling 25 minutes.    Follow-up:  Pharmacist 06/04/22 Patient seen with Jerry Caras, PharmD PGY-1 Pharmacy Resident and Revonda Standard, PharmD Candidate.

## 2022-06-03 NOTE — Assessment & Plan Note (Signed)
History of high blood pressure not on any medications with goal presssure of <130/80. Today's initial amb blood pressure readings was elevated at 180/102.  - Reviewed process of wearing monitor for 24 hours and returning to clinic tomorrow to review results. Written patient instructions provided. Patient verbalized understanding of treatment plan.

## 2022-06-04 ENCOUNTER — Ambulatory Visit (INDEPENDENT_AMBULATORY_CARE_PROVIDER_SITE_OTHER): Payer: Medicaid Other | Admitting: Pharmacist

## 2022-06-04 VITALS — BP 147/91 | HR 90

## 2022-06-04 DIAGNOSIS — R030 Elevated blood-pressure reading, without diagnosis of hypertension: Secondary | ICD-10-CM | POA: Diagnosis not present

## 2022-06-04 NOTE — Progress Notes (Signed)
S:     Chief Complaint  Patient presents with   Medication Management    Ambulatory BP monitoring Day 2   62 y.o. female who presents for hypertension evaluation, education, and management.  PMH is significant for elevated blood pressure.  Patient was referred and last seen by Primary Care Provider, Dr. Jena Gauss, on 05/28/22.  At visit yesterday, ambulatory 24-hour BP monitor was placed onto patient.    Patient has not been diagnosed with hypertension.   Current BP Medications include:  None  Antihypertensives tried in the past include: None  Family History: Multiple women in the family have history of CV events (heart attacks)  Day #2 - Patient returns to clinic and reports she was able to wear the cuff for 9 hours. She reports the cuff had fallen off at 6pm and was not able to properly reattach the cuff for the rest of the 24-hour period, leading to a production of partial data.  O:  Review of Systems  All other systems reviewed and are negative.   Physical Exam Constitutional:      Appearance: Normal appearance.  Pulmonary:     Breath sounds: Normal breath sounds.  Neurological:     Mental Status: She is alert.  Psychiatric:        Mood and Affect: Mood normal.        Behavior: Behavior normal.        Thought Content: Thought content normal.        Judgment: Judgment normal.     Last 3 Office BP readings: BP Readings from Last 3 Encounters:  06/03/22 (!) 180/102  05/28/22 137/73  05/19/22 136/89    Clinical Atherosclerotic Cardiovascular Disease (ASCVD): No  The ASCVD Risk score (Arnett DK, et al., 2019) failed to calculate for the following reasons:   Cannot find a previous HDL lab   Cannot find a previous total cholesterol lab  Basic Metabolic Panel    Component Value Date/Time   NA 142 07/28/2017 0651   K 3.5 07/28/2017 0651   CL 106 07/28/2017 0651   CO2 27 07/28/2017 0651   GLUCOSE 99 07/28/2017 0651   BUN 13 07/28/2017 0651   CREATININE 0.50  07/28/2017 0651   CALCIUM 9.5 07/28/2017 0651   GFRNONAA >60 07/28/2017 0651   GFRAA >60 07/28/2017 0651    ABPM Study Data: Arm Placement left arm  Overall Mean 24hr BP:   147/91 mmHg  HR: 90  Daytime Mean BP:  147/91 mmHg  HR: 90  Nighttime Mean BP:  Not determined Dipping Pattern: Could not be determined   For Office Goal Goal BP of <130/80:  ABPM thresholds: Overall BP < 125/75, daytime BP <130/80 mmHg, sleeptime BP <110/65 mmHg   Patient is participating in a Managed Medicaid Plan:  Yes   A/P: History of elevated blood pressure, currently taking no antihypertensives; Goal presssure of <130/80.  Found to have persistently elevated and uncontrolled blood pressure with 9 hours of ambulatory blood pressure evaluation which demonstrates an average AWAKE blood pressure of 147/91 mmHg.  Nocturnal dipping pattern is  unable to be determined due to cuff sliding down her arm before sleeping .   -Reviewed and explained results of 24-hour BP monitor report with patient -Patient was hesitant to start medication and preferred natural methods to reduce blood pressure, including changing diet and exercising (water aerobics at the [facility]) - No initiation of antihypertensives today. -Patient verbalized understanding to follow up with PCP in 1 month, check her  blood pressures at home, and bring a log of the readings to her next PCP visit. Will reevaluate state of her blood pressure.  Results reviewed and written information provided.    Written patient instructions provided. Patient verbalized understanding of treatment plan.  Total time in face to face counseling 20 minutes.    Follow-up:  PCP visit: 1 month Patient seen with Revonda Standard, PharmD Candidate.

## 2022-06-04 NOTE — Patient Instructions (Signed)
It was nice to see you today!  Your goal blood pressure is <130/80. We are not starting any medications with you today.  Monitor blood pressure at home daily and keep a log (on your phone or piece of paper) to bring with you to your next visit. Write down date, time, blood pressure and pulse.  Keep up the good work with diet and exercise. Aim for a diet full of vegetables, fruit and lean meats (chicken, [place], fish). Try to limit salt intake by eating fresh or frozen vegetables (instead of canned), rinse canned vegetables prior to cooking and do not add any additional salt to meals.

## 2022-06-04 NOTE — Assessment & Plan Note (Signed)
History of elevated blood pressure, currently taking no antihypertensives; Goal presssure of <130/80.  Found to have persistently elevated and uncontrolled blood pressure with 9 hours of ambulatory blood pressure evaluation which demonstrates an average AWAKE blood pressure of 147/91 mmHg.  Nocturnal dipping pattern is unable to be determined due to cuff sliding down her arm before sleeping.   -Reviewed and explained results of 24-hour BP monitor report with patient -Patient was hesitant to start medication and preferred natural methods to reduce blood pressure, including changing diet and exercising (water aerobics at the [facility]) - No initiation of antihypertensives today. -Patient verbalized understanding to follow up with PCP in 1 month, check her blood pressures at home, and bring a log of the readings to her next PCP visit. Will reevaluate state of her blood pressure.

## 2022-06-04 NOTE — Progress Notes (Signed)
Reviewed and agree with Dr Koval's plan.   

## 2022-06-05 DIAGNOSIS — M170 Bilateral primary osteoarthritis of knee: Secondary | ICD-10-CM | POA: Diagnosis not present

## 2022-06-05 DIAGNOSIS — M1712 Unilateral primary osteoarthritis, left knee: Secondary | ICD-10-CM | POA: Diagnosis not present

## 2022-06-05 DIAGNOSIS — M1711 Unilateral primary osteoarthritis, right knee: Secondary | ICD-10-CM | POA: Diagnosis not present

## 2022-07-04 ENCOUNTER — Ambulatory Visit: Payer: Medicaid Other | Admitting: Cardiovascular Disease

## 2022-07-08 ENCOUNTER — Ambulatory Visit: Payer: Medicaid Other | Admitting: Student

## 2022-07-09 ENCOUNTER — Other Ambulatory Visit: Payer: Self-pay

## 2022-07-09 DIAGNOSIS — Z1231 Encounter for screening mammogram for malignant neoplasm of breast: Secondary | ICD-10-CM

## 2022-07-15 ENCOUNTER — Ambulatory Visit
Admission: EM | Admit: 2022-07-15 | Discharge: 2022-07-15 | Disposition: A | Discharge status: Home / Self Care | Payer: Medicaid Other | Attending: Internal Medicine | Admitting: Internal Medicine

## 2022-07-15 DIAGNOSIS — J4541 Moderate persistent asthma with (acute) exacerbation: Secondary | ICD-10-CM | POA: Diagnosis not present

## 2022-07-15 MED ORDER — ALBUTEROL SULFATE (2.5 MG/3ML) 0.083% IN NEBU: 2.5000 mg | INHALATION_SOLUTION | Freq: Four times a day (QID) | RESPIRATORY_TRACT | 12 refills | Status: DC | PRN

## 2022-07-15 MED ORDER — IPRATROPIUM-ALBUTEROL 0.5-2.5 (3) MG/3ML IN SOLN
3.0000 mL | Freq: Once | RESPIRATORY_TRACT | Status: AC
  Administered 2022-07-15: 3 mL via RESPIRATORY_TRACT

## 2022-07-15 MED ORDER — PREDNISONE 20 MG PO TABS
20.0000 mg | ORAL_TABLET | Freq: Once | ORAL | Status: AC
  Administered 2022-07-15: 20 mg via ORAL

## 2022-07-15 MED ORDER — DEXAMETHASONE SODIUM PHOSPHATE 10 MG/ML IJ SOLN: 10.0000 mg | Freq: Once | INTRAMUSCULAR | Status: DC

## 2022-07-15 MED ORDER — BENZONATATE 100 MG PO CAPS: 100.0000 mg | ORAL_CAPSULE | Freq: Three times a day (TID) | ORAL | 0 refills | Status: DC

## 2022-07-15 MED ORDER — PREDNISONE 20 MG PO TABS: 40.0000 mg | ORAL_TABLET | Freq: Every day | ORAL | 0 refills | Status: AC

## 2022-07-15 NOTE — ED Triage Notes (Signed)
Here for Asthma Exacerbation due to allergies. Symptoms flared up starting "Friday" now with SOB due to Cough. Multiple allergies.

## 2022-07-15 NOTE — ED Provider Notes (Signed)
[facility]    CSN: [id number] Arrival date & time: 07/15/22  1612      History   Chief Complaint Chief Complaint  Patient presents with   Allergies    Causing Asthma Exacerbation    HPI Lauren Nicholson is a 62 y.o. female.   Patient presents to urgent care for evaluation of cough, shortness of breath, chest tightness, nasal congestion, and generalized fatigue since Friday, Jul 11, 2022 (4 days ago).  History of asthma and states her albuterol inhaler is not helping with her symptoms.  Cough is sometimes productive and sometimes dry.  Denies leg swelling, orthopnea, dizziness, vision changes, nausea, vomiting, abdominal pain, and diarrhea. No recent known sick contacts with similar symptoms. Former smoker, denies current drug use. No recent fevers/chills. Denies recent antibiotic/steroid use. Using mucinex as needed without relief.      Past Medical History:  Diagnosis Date   ADHD    Asthma    Diverticulitis    Fibrocystic breast    Fibroids    uterine fibroids with cyst   Neck pain    radiates to LUE   OA (osteoarthritis)    B/L knees    Patient Active Problem List   Diagnosis Date Noted   Elevated blood pressure reading 06/03/2022   Asthma 05/28/2022   Hypercholesterolemia 05/20/2022   Ocular headache 05/20/2022   Bilateral primary osteoarthritis of knee 05/20/2022    Past Surgical History:  Procedure Laterality Date   COLONOSCOPY     RADIOLOGY WITH ANESTHESIA N/A 07/28/2017   Procedure: MRI OF CERVICAL SPINE WITHOUT CONTRAST;  Surgeon: Radiologist, Medication, MD;  Location: [facility] OR;  Service: Radiology;  Laterality: N/A;    OB History   No obstetric history on file.      Home Medications    Prior to Admission medications   Medication Sig Start Date End Date Taking? Authorizing Provider  Acetylcysteine (NAC) 600 MG CAPS Take 600 mg by mouth daily.   Yes [provider]  albuterol (PROVENTIL HFA;VENTOLIN HFA) 108 (90 BASE) MCG/ACT  inhaler Inhale 2 puffs into the lungs every 4 (four) hours as needed for wheezing or shortness of breath. Patient taking differently: Inhale 2 puffs into the lungs every 4 (four) hours as needed for wheezing or shortness of breath. Last used: 300pm. 01/06/15  Yes Everlene Farrier, PA-C  albuterol (PROVENTIL) (2.5 MG/3ML) 0.083% nebulizer solution Take 3 mLs (2.5 mg total) by nebulization every 6 (six) hours as needed for wheezing or shortness of breath. 07/15/22  Yes Tannar Broker, Donavan Burnet, FNP  benzonatate (TESSALON) 100 MG capsule Take 1 capsule (100 mg total) by mouth every 8 (eight) hours. 07/15/22  Yes Carlisle Beers, FNP  cetirizine (ZYRTEC) 10 MG tablet Take 10 mg by mouth daily.   Yes [provider]  meloxicam (MOBIC) 7.5 MG tablet Take 7.5 mg by mouth daily.   Yes [provider]  montelukast (SINGULAIR) 10 MG tablet Take 10 mg by mouth at bedtime.   Yes [provider]  OVER THE COUNTER MEDICATION Take 1 tablet by mouth daily. Super beets   Yes [provider]  predniSONE (DELTASONE) 20 MG tablet Take 2 tablets (40 mg total) by mouth daily for 5 days. 07/15/22 07/20/22 Yes StanhopeDonavan Burnet, FNP  zinc sulfate 220 (50 Zn) MG capsule Take 40 mg by mouth daily.   Yes [provider]  Cholecalciferol (VITAMIN D-3) 125 MCG (5000 UT) TABS Take 5,000 Units by mouth daily.    [provider]  diclofenac Sodium (VOLTAREN) 1 % GEL Apply 2 g topically 4 (four) times daily.    [provider]  mometasone-formoterol (DULERA) 200-5 MCG/ACT AERO Inhale 1 puff into the lungs in the morning and at bedtime. 05/28/22   Alfredo Martinez, MD  OVER THE COUNTER MEDICATION Apply 1 application topically daily as needed (knee pain). CBD Cream    [provider]  OVER THE COUNTER MEDICATION Take 1 tablet by mouth daily. [facility]    [provider]    Family History Family History  Problem Relation Age of Onset   Cancer Mother     Heart disease Father    Breast cancer Half-Sister     Social History Social History   Tobacco Use   Smoking status: Former    Types: Cigarettes    Quit date: 03/27/2017    Years since quitting: 5.3   Smokeless tobacco: Never  Vaping Use   Vaping Use: Never used  Substance Use Topics   Alcohol use: Not Currently   Drug use: Not Currently     Allergies   Etodolac and Ciprofloxacin   Review of Systems Review of Systems Per HPI  Physical Exam Triage Vital Signs ED Triage Vitals  Enc Vitals Group     BP 07/15/22 1639 128/85     Pulse Rate 07/15/22 1639 86     Resp 07/15/22 1639 (!) 22     Temp 07/15/22 1639 98.2 F (36.8 C)     Temp Source 07/15/22 1639 Oral     SpO2 07/15/22 1639 95 %     Weight 07/15/22 1635 207 lb (93.9 kg)     Height 07/15/22 1635 4\' 11"  (1.499 m)     Head Circumference --      Peak Flow --      Pain Score 07/15/22 1635 0     Pain Loc --      Pain Edu? --      Excl. in GC? --    No data found.  Updated Vital Signs BP 128/85 (BP Location: Left Arm)   Pulse 86   Temp 98.2 F (36.8 C) (Oral)   Resp (!) 22   Ht 4\' 11"  (1.499 m)   Wt 207 lb (93.9 kg)   SpO2 95%   BMI 41.81 kg/m   Visual Acuity Right Eye Distance:   Left Eye Distance:   Bilateral Distance:    Right Eye Near:   Left Eye Near:    Bilateral Near:     Physical Exam Vitals and nursing note reviewed.  Constitutional:      Appearance: She is ill-appearing. She is not toxic-appearing.  HENT:     Head: Normocephalic and atraumatic.     Right Ear: Hearing, tympanic membrane, ear canal and external ear normal.     Left Ear: Hearing, tympanic membrane, ear canal and external ear normal.     Nose: Congestion present.     Mouth/Throat:     Lips: Pink.     Mouth: Mucous membranes are moist.     Pharynx: No posterior oropharyngeal erythema.  Eyes:     General: Lids are normal. Vision grossly intact. Gaze aligned appropriately.        Right eye: No discharge.        Left  eye: No discharge.     Extraocular Movements: Extraocular movements intact.     Conjunctiva/sclera: Conjunctivae normal.  Cardiovascular:     Rate and Rhythm: Normal rate and regular  rhythm.     Heart sounds: Normal heart sounds, S1 normal and S2 normal.  Pulmonary:     Effort: Pulmonary effort is normal. No respiratory distress.     Breath sounds: Normal air entry. No stridor. Wheezing (Significant inspiratory and expiratory wheezing heard to all lung fields bilaterally.) present. No rhonchi or rales.  Chest:     Chest wall: No tenderness.  Musculoskeletal:     Cervical back: Full passive range of motion without pain and neck supple. No tenderness.     Right lower leg: No edema.     Left lower leg: No edema.  Lymphadenopathy:     Cervical: No cervical adenopathy.  Skin:    General: Skin is warm and dry.     Capillary Refill: Capillary refill takes less than 2 seconds.     Findings: No rash.  Neurological:     General: No focal deficit present.     Mental Status: She is alert and oriented to person, place, and time. Mental status is at baseline.     Cranial Nerves: No dysarthria or facial asymmetry.  Psychiatric:        Mood and Affect: Mood normal.        Speech: Speech normal.        Behavior: Behavior normal.        Thought Content: Thought content normal.        Judgment: Judgment normal.      UC Treatments / Results  Labs (all labs ordered are listed, but only abnormal results are displayed) Labs Reviewed - No data to display  EKG   Radiology No results found.  Procedures Procedures (including critical care time)  Medications Ordered in UC Medications  ipratropium-albuterol (DUONEB) 0.5-2.5 (3) MG/3ML nebulizer solution 3 mL (3 mLs Nebulization Given 07/15/22 1729)  predniSONE (DELTASONE) tablet 20 mg (20 mg Oral Given 07/15/22 1729)    Initial Impression / Assessment and Plan / UC Course  I have reviewed the triage vital signs and the nursing  notes.  Pertinent labs & imaging results that were available during my care of the patient were reviewed by me and considered in my medical decision making (see chart for details).   1. Moderate persistent asthma with acute exacerbation Presentation consistent with acute asthma exacerbation.  Deferred imaging based on stable cardiopulmonary exam and significant improvement in lung sounds after DuoNeb breathing treatment.  Offered dexamethasone 10 mg IM in clinic, patient declines.  Therefore, we will start her prednisone steroid burst tonight with half dose of 20 mg prednisone to reduce inflammation to the lungs.  May start prednisone 40 mg once daily for the next 5 days starting tomorrow with breakfast.  No NSAID containing medications while taking prednisone. Albuterol inhaler every 4-6 hours as needed for cough, shortness of breath, and wheeze. Albuterol nebulizer solution sent to pharmacy as well. Tessalon perles every 8 hours as needed for cough.   Discussed physical exam and available lab work findings in clinic with patient.  Counseled patient regarding appropriate use of medications and potential side effects for all medications recommended or prescribed today. Discussed red flag signs and symptoms of worsening condition,when to call the PCP office, return to urgent care, and when to seek higher level of care in the emergency department. Patient verbalizes understanding and agreement with plan. All questions answered. Patient discharged in stable condition.    Final Clinical Impressions(s) / UC Diagnoses   Final diagnoses:  Moderate persistent asthma with acute exacerbation  Discharge Instructions      You were evaluated today for an asthma exacerbation likely triggered by a viral upper respiratory infection.   - Use albuterol every 4-6 hours as needed for cough, shortness of breath, and wheeze. - Take the steroid sent to the pharmacy as directed to help reduce lung inflammation  and decrease the risk of another attack in the next few days. No NSAIDs like ibuprofen or aleve/naproxen while taking steroid. Take with food to avoid stomach upset. - Take prescribed cough medicine as directed.  If your symptoms do not improve in the next 2-3 days with interventions, please return. Please seek medical care for new or returning symptoms, such as difficulty breathing that doesn't improve with your medications, chest pain, voice changes, high fevers, confusion, or other new or worsening symptoms. Follow-up with PCP for ongoing management of asthma. I hope you feel better!       ED Prescriptions     Medication Sig Dispense Auth. Provider   albuterol (PROVENTIL) (2.5 MG/3ML) 0.083% nebulizer solution Take 3 mLs (2.5 mg total) by nebulization every 6 (six) hours as needed for wheezing or shortness of breath. 75 mL Reita May M, FNP   benzonatate (TESSALON) 100 MG capsule Take 1 capsule (100 mg total) by mouth every 8 (eight) hours. 21 capsule Reita May M, FNP   predniSONE (DELTASONE) 20 MG tablet Take 2 tablets (40 mg total) by mouth daily for 5 days. 10 tablet Carlisle Beers, FNP      PDMP not reviewed this encounter.   Carlisle Beers, [place] 07/15/22 1839

## 2022-07-15 NOTE — Discharge Instructions (Signed)
You were evaluated today for an asthma exacerbation likely triggered by a viral upper respiratory infection.   - Use albuterol every 4-6 hours as needed for cough, shortness of breath, and wheeze. - Take the steroid sent to the pharmacy as directed to help reduce lung inflammation and decrease the risk of another attack in the next few days. No NSAIDs like ibuprofen or aleve/naproxen while taking steroid. Take with food to avoid stomach upset. - Take prescribed cough medicine as directed.  If your symptoms do not improve in the next 2-3 days with interventions, please return. Please seek medical care for new or returning symptoms, such as difficulty breathing that doesn't improve with your medications, chest pain, voice changes, high fevers, confusion, or other new or worsening symptoms. Follow-up with PCP for ongoing management of asthma. I hope you feel better!  

## 2022-07-22 ENCOUNTER — Telehealth: Payer: Medicaid Other | Admitting: Family Medicine

## 2022-07-22 ENCOUNTER — Encounter: Payer: Self-pay | Admitting: Family Medicine

## 2022-07-22 DIAGNOSIS — J208 Acute bronchitis due to other specified organisms: Secondary | ICD-10-CM

## 2022-07-22 DIAGNOSIS — B9689 Other specified bacterial agents as the cause of diseases classified elsewhere: Secondary | ICD-10-CM

## 2022-07-22 MED ORDER — DOXYCYCLINE HYCLATE 100 MG PO TABS: 100.0000 mg | ORAL_TABLET | Freq: Two times a day (BID) | ORAL | 0 refills | Status: AC

## 2022-07-22 MED ORDER — PSEUDOEPH-BROMPHEN-DM 30-2-10 MG/5ML PO SYRP: 5.0000 mL | ORAL_SOLUTION | Freq: Four times a day (QID) | ORAL | 0 refills | Status: AC | PRN

## 2022-07-22 NOTE — Patient Instructions (Signed)
Lauren Nicholson, thank you for joining Freddy Finner, NP for today's virtual visit.  While this provider is not your primary care provider (PCP), if your PCP is located in our provider database this encounter information will be shared with them immediately following your visit.   A [facility] MyChart account gives you access to today's visit and all your visits, tests, and labs performed at [facility] " click here if you don't have a [facility] MyChart account or go to [email or web address]  Consent: (Patient) Lauren Nicholson provided verbal consent for this virtual visit at the beginning of the encounter.  Current Medications:  Current Outpatient Medications:    brompheniramine-pseudoephedrine-DM 30-2-10 MG/5ML syrup, Take 5 mLs by mouth 4 (four) times daily as needed., Disp: 120 mL, Rfl: 0   doxycycline (VIBRA-TABS) 100 MG tablet, Take 1 tablet (100 mg total) by mouth 2 (two) times daily for 10 days., Disp: 20 tablet, Rfl: 0   Acetylcysteine (NAC) 600 MG CAPS, Take 600 mg by mouth daily., Disp: , Rfl:    albuterol (PROVENTIL HFA;VENTOLIN HFA) 108 (90 BASE) MCG/ACT inhaler, Inhale 2 puffs into the lungs every 4 (four) hours as needed for wheezing or shortness of breath. (Patient taking differently: Inhale 2 puffs into the lungs every 4 (four) hours as needed for wheezing or shortness of breath. Last used: 300pm.), Disp: 1 Inhaler, Rfl: 0   albuterol (PROVENTIL) (2.5 MG/3ML) 0.083% nebulizer solution, Take 3 mLs (2.5 mg total) by nebulization every 6 (six) hours as needed for wheezing or shortness of breath., Disp: 75 mL, Rfl: 12   benzonatate (TESSALON) 100 MG capsule, Take 1 capsule (100 mg total) by mouth every 8 (eight) hours., Disp: 21 capsule, Rfl: 0   cetirizine (ZYRTEC) 10 MG tablet, Take 10 mg by mouth daily., Disp: , Rfl:    Cholecalciferol (VITAMIN D-3) 125 MCG (5000 UT) TABS, Take 5,000 Units by mouth daily., Disp: , Rfl:    diclofenac Sodium (VOLTAREN) 1 % GEL,  Apply 2 g topically 4 (four) times daily., Disp: , Rfl:    meloxicam (MOBIC) 7.5 MG tablet, Take 7.5 mg by mouth daily., Disp: , Rfl:    mometasone-formoterol (DULERA) 200-5 MCG/ACT AERO, Inhale 1 puff into the lungs in the morning and at bedtime., Disp: 1 each, Rfl: 3   montelukast (SINGULAIR) 10 MG tablet, Take 10 mg by mouth at bedtime., Disp: , Rfl:    OVER THE COUNTER MEDICATION, Apply 1 application topically daily as needed (knee pain). CBD Cream, Disp: , Rfl:    OVER THE COUNTER MEDICATION, Take 1 tablet by mouth daily. [place], Disp: , Rfl:    OVER THE COUNTER MEDICATION, Take 1 tablet by mouth daily. Super beets, Disp: , Rfl:    zinc sulfate 220 (50 Zn) MG capsule, Take 40 mg by mouth daily., Disp: , Rfl:    Medications ordered in this encounter:  Meds ordered this encounter  Medications   doxycycline (VIBRA-TABS) 100 MG tablet    Sig: Take 1 tablet (100 mg total) by mouth 2 (two) times daily for 10 days.    Dispense:  20 tablet    Refill:  0    Order Specific Question:   Supervising Provider    Answer:   Merrilee Jansky [id number]   brompheniramine-pseudoephedrine-DM 30-2-10 MG/5ML syrup    Sig: Take 5 mLs by mouth 4 (four) times daily as needed.    Dispense:  120 mL    Refill:  0    Order  Specific Question:   Supervising Provider    Answer:   Merrilee Jansky [[id number]]     *If you need refills on other medications prior to your next appointment, please contact your pharmacy*  Follow-Up: Call back or seek an in-person evaluation if the symptoms worsen or if the condition fails to improve as anticipated.  [facility] [phone]  Other Instructions Acute Bronchitis, Adult  Acute bronchitis is when air tubes in the lungs (bronchi) suddenly get swollen. The condition can make it hard for you to breathe. In adults, acute bronchitis usually goes away within 2 weeks. A cough caused by bronchitis may last up to 3 weeks. Smoking, allergies, and asthma can make  the condition worse. What are the causes? Germs that cause cold and flu (viruses). The most common cause of this condition is the virus that causes the common cold. Bacteria. Substances that bother (irritate) the lungs, including: Smoke from cigarettes and other types of tobacco. Dust and pollen. Fumes from chemicals, gases, or burned fuel. Indoor or outdoor air pollution. What increases the risk? A weak body's defense system. This is also called the immune system. Any condition that affects your lungs and breathing, such as asthma. What are the signs or symptoms? A cough. Coughing up clear, yellow, or green mucus. Making high-pitched whistling sounds when you breathe, most often when you breathe out (wheezing). Runny or stuffy nose. Having too much mucus in your lungs (chest congestion). Shortness of breath. Body aches. A sore throat. How is this treated? Acute bronchitis may go away over time without treatment. Your doctor may tell you to: Drink more fluids. This will help thin your mucus so it is easier to cough up. Use a device that gets medicine into your lungs (inhaler). Use a vaporizer or a humidifier. These are machines that add water to the air. This helps with coughing and poor breathing. Take a medicine that thins mucus and helps clear it from your lungs. Take a medicine that prevents or stops coughing. It is not common to take an antibiotic medicine for this condition. Follow these instructions at home:  Take over-the-counter and prescription medicines only as told by your doctor. Use an inhaler, vaporizer, or humidifier as told by your doctor. Take two teaspoons (10 mL) of honey at bedtime. This helps lessen your coughing at night. Drink enough fluid to keep your pee (urine) pale yellow. Do not smoke or use any products that contain nicotine or tobacco. If you need help quitting, ask your doctor. Get a lot of rest. Return to your normal activities when your doctor  says that it is safe. Keep all follow-up visits. How is this prevented?  Wash your hands often with soap and water for at least 20 seconds. If you cannot use soap and water, use hand sanitizer. Avoid contact with people who have cold symptoms. Try not to touch your mouth, nose, or eyes with your hands. Avoid breathing in smoke or chemical fumes. Make sure to get the flu shot every year. Contact a doctor if: Your symptoms do not get better in 2 weeks. You have trouble coughing up the mucus. Your cough keeps you awake at night. You have a fever. Get help right away if: You cough up blood. You have chest pain. You have very bad shortness of breath. You faint or keep feeling like you are going to faint. You have a very bad headache. Your fever or chills get worse. These symptoms may be an  emergency. Get help right away. Call your local emergency services (911 in the [place] Do not wait to see if the symptoms will go away. Do not drive yourself to the hospital. Summary Acute bronchitis is when air tubes in the lungs (bronchi) suddenly get swollen. In adults, acute bronchitis usually goes away within 2 weeks. Drink more fluids. This will help thin your mucus so it is easier to cough up. Take over-the-counter and prescription medicines only as told by your doctor. Contact a doctor if your symptoms do not improve after 2 weeks of treatment. This information is not intended to replace advice given to you by your health care provider. Make sure you discuss any questions you have with your health care provider. Document Revised: 06/13/2020 Document Reviewed: 06/13/2020 Elsevier Patient Education  2024 Elsevier Inc.   If you have been instructed to have an in-person evaluation today at a local Urgent Care facility, please use the link below. It will take you to a list of all of our available [facility], including address, phone number and hours of operation. Please do not delay  care.  [facility]  If you or a family member do not have a primary care provider, use the link below to schedule a visit and establish care. When you choose a [facility] primary care physician or advanced practice provider, you gain a long-term partner in health. Find a Primary Care Provider  Learn more about [facility]'s in-office and virtual care options: [place] [facility] - Get Care Now

## 2022-07-22 NOTE — Progress Notes (Signed)
Virtual Visit Consent   Lauren Nicholson, you are scheduled for a virtual visit with a [facility] provider today. Just as with appointments in the office, your consent must be obtained to participate. Your consent will be active for this visit and any virtual visit you may have with one of our providers in the next 365 days. If you have a MyChart account, a copy of this consent can be sent to you electronically.  As this is a virtual visit, video technology does not allow for your provider to perform a traditional examination. This may limit your provider's ability to fully assess your condition. If your provider identifies any concerns that need to be evaluated in person or the need to arrange testing (such as labs, EKG, etc.), we will make arrangements to do so. Although advances in technology are sophisticated, we cannot ensure that it will always work on either your end or our end. If the connection with a video visit is poor, the visit may have to be switched to a telephone visit. With either a video or telephone visit, we are not always able to ensure that we have a secure connection.  By engaging in this virtual visit, you consent to the provision of healthcare and authorize for your insurance to be billed (if applicable) for the services provided during this visit. Depending on your insurance coverage, you may receive a charge related to this service.  I need to obtain your verbal consent now. Are you willing to proceed with your visit today? LOTTA AUKER has provided verbal consent on 07/22/2022 for a virtual visit (video or telephone). Freddy Finner, NP  Date: 07/22/2022 1:21 PM  Virtual Visit via Video Note   I, Freddy Finner, connected with  CHRISA RENBARGER  ([id number], 04-28-1960) on 07/22/22 at  1:15 PM EDT by a video-enabled telemedicine application and verified that I am speaking with the correct person using two identifiers.  Location: Patient: Virtual Visit Location Patient:  Home Provider: Virtual Visit Location Provider: Home Office   I discussed the limitations of evaluation and management by telemedicine and the availability of in person appointments. The patient expressed understanding and agreed to proceed.    History of Present Illness: Lauren Nicholson is a 62 y.o. who identifies as a female who was assigned female at birth, and is being seen today for on going cough.  Was seen a week ago in UC and treated for asthma. Nothing has worked to improve cough or congestion. Reports still having cough- and mucus that is thick and not moving even with mucinex use. Notes at times some wheezing and "wet gargle" sounds in chest She is without chest pain, shortness of breath, fevers or chills   Problems:  Patient Active Problem List   Diagnosis Date Noted   Elevated blood pressure reading 06/03/2022   Asthma 05/28/2022   Hypercholesterolemia 05/20/2022   Ocular headache 05/20/2022   Bilateral primary osteoarthritis of knee 05/20/2022    Allergies:  Allergies  Allergen Reactions   Etodolac Diarrhea and Nausea And Vomiting   Ciprofloxacin Rash   Medications:  Current Outpatient Medications:    Acetylcysteine (NAC) 600 MG CAPS, Take 600 mg by mouth daily., Disp: , Rfl:    albuterol (PROVENTIL HFA;VENTOLIN HFA) 108 (90 BASE) MCG/ACT inhaler, Inhale 2 puffs into the lungs every 4 (four) hours as needed for wheezing or shortness of breath. (Patient taking differently: Inhale 2 puffs into the lungs every 4 (four) hours as needed  for wheezing or shortness of breath. Last used: 300pm.), Disp: 1 Inhaler, Rfl: 0   albuterol (PROVENTIL) (2.5 MG/3ML) 0.083% nebulizer solution, Take 3 mLs (2.5 mg total) by nebulization every 6 (six) hours as needed for wheezing or shortness of breath., Disp: 75 mL, Rfl: 12   benzonatate (TESSALON) 100 MG capsule, Take 1 capsule (100 mg total) by mouth every 8 (eight) hours., Disp: 21 capsule, Rfl: 0   cetirizine (ZYRTEC) 10 MG tablet,  Take 10 mg by mouth daily., Disp: , Rfl:    Cholecalciferol (VITAMIN D-3) 125 MCG (5000 UT) TABS, Take 5,000 Units by mouth daily., Disp: , Rfl:    diclofenac Sodium (VOLTAREN) 1 % GEL, Apply 2 g topically 4 (four) times daily., Disp: , Rfl:    meloxicam (MOBIC) 7.5 MG tablet, Take 7.5 mg by mouth daily., Disp: , Rfl:    mometasone-formoterol (DULERA) 200-5 MCG/ACT AERO, Inhale 1 puff into the lungs in the morning and at bedtime., Disp: 1 each, Rfl: 3   montelukast (SINGULAIR) 10 MG tablet, Take 10 mg by mouth at bedtime., Disp: , Rfl:    OVER THE COUNTER MEDICATION, Apply 1 application topically daily as needed (knee pain). CBD Cream, Disp: , Rfl:    OVER THE COUNTER MEDICATION, Take 1 tablet by mouth daily. [place], Disp: , Rfl:    OVER THE COUNTER MEDICATION, Take 1 tablet by mouth daily. Super beets, Disp: , Rfl:    zinc sulfate 220 (50 Zn) MG capsule, Take 40 mg by mouth daily., Disp: , Rfl:   Observations/Objective: Patient is well-developed, well-nourished in no acute distress.  Resting comfortably  at home.  Head is normocephalic, atraumatic.  No labored breathing.  Speech is clear and coherent with logical content.  Patient is alert and oriented at baseline.  Cough present  Assessment and Plan:  1. Acute bacterial bronchitis  - doxycycline (VIBRA-TABS) 100 MG tablet; Take 1 tablet (100 mg total) by mouth 2 (two) times daily for 10 days.  Dispense: 20 tablet; Refill: 0 - brompheniramine-pseudoephedrine-DM 30-2-10 MG/5ML syrup; Take 5 mLs by mouth 4 (four) times daily as needed.  Dispense: 120 mL; Refill: 0  - Take meds as prescribed - Rest voice -Avoid DM medications as I have that in the cough syrup I ordered you - Use saline nose sprays frequently to help soothe nasal passages if they are drying out. - Stay hydrated by drinking plenty of fluids   If you do not improve you will need a follow up visit in person.               Reviewed side effects, risks and benefits of  medication.    Patient acknowledged agreement and understanding of the plan.   Past Medical, Surgical, Social History, Allergies, and Medications have been Reviewed.     Follow Up Instructions: I discussed the assessment and treatment plan with the patient. The patient was provided an opportunity to ask questions and all were answered. The patient agreed with the plan and demonstrated an understanding of the instructions.  A copy of instructions were sent to the patient via MyChart unless otherwise noted below.    The patient was advised to call back or seek an in-person evaluation if the symptoms worsen or if the condition fails to improve as anticipated.  Time:  I spent 10 minutes with the patient via telehealth technology discussing the above problems/concerns.    Freddy Finner, NP

## 2022-07-23 DIAGNOSIS — Z1231 Encounter for screening mammogram for malignant neoplasm of breast: Secondary | ICD-10-CM

## 2022-08-26 ENCOUNTER — Telehealth: Payer: Self-pay

## 2022-08-26 DIAGNOSIS — R519 Headache, unspecified: Secondary | ICD-10-CM

## 2022-08-26 NOTE — Telephone Encounter (Signed)
Patient calls nurse line requesting referral to new eye doctor. She reports that Dr. Nile Riggs no longer accepts her insurance.   Requesting that new referral be sent to Dr. Mia Creek- [facility]. Insurance does require referral prior to patient being able to schedule.   Forwarding request to PCP.   Lauren Prude, RN

## 2022-09-15 ENCOUNTER — Ambulatory Visit: Payer: Medicaid Other | Admitting: Cardiovascular Disease

## 2022-09-15 NOTE — Progress Notes (Deleted)
No chief complaint on file.  History of Present Illness: 62 yo female with history of asthma who is here today as a new consult, referred by Dr. ***, for the evaluation of ***. She tells me today that she ***  Primary Care Physician: Alfredo Martinez, MD   Past Medical History:  Diagnosis Date   ADHD    Asthma    Diverticulitis    Fibrocystic breast    Fibroids    uterine fibroids with cyst   Neck pain    radiates to LUE   OA (osteoarthritis)    B/L knees    Past Surgical History:  Procedure Laterality Date   COLONOSCOPY     RADIOLOGY WITH ANESTHESIA N/A 07/28/2017   Procedure: MRI OF CERVICAL SPINE WITHOUT CONTRAST;  Surgeon: Radiologist, Medication, MD;  Location: [facility] OR;  Service: Radiology;  Laterality: N/A;    Current Outpatient Medications  Medication Sig Dispense Refill   Acetylcysteine (NAC) 600 MG CAPS Take 600 mg by mouth daily.     albuterol (PROVENTIL HFA;VENTOLIN HFA) 108 (90 BASE) MCG/ACT inhaler Inhale 2 puffs into the lungs every 4 (four) hours as needed for wheezing or shortness of breath. (Patient taking differently: Inhale 2 puffs into the lungs every 4 (four) hours as needed for wheezing or shortness of breath. Last used: 300pm.) 1 Inhaler 0   albuterol (PROVENTIL) (2.5 MG/3ML) 0.083% nebulizer solution Take 3 mLs (2.5 mg total) by nebulization every 6 (six) hours as needed for wheezing or shortness of breath. 75 mL 12   benzonatate (TESSALON) 100 MG capsule Take 1 capsule (100 mg total) by mouth every 8 (eight) hours. 21 capsule 0   brompheniramine-pseudoephedrine-DM 30-2-10 MG/5ML syrup Take 5 mLs by mouth 4 (four) times daily as needed. 120 mL 0   cetirizine (ZYRTEC) 10 MG tablet Take 10 mg by mouth daily.     Cholecalciferol (VITAMIN D-3) 125 MCG (5000 UT) TABS Take 5,000 Units by mouth daily.     diclofenac Sodium (VOLTAREN) 1 % GEL Apply 2 g topically 4 (four) times daily.     meloxicam (MOBIC) 7.5 MG tablet Take 7.5 mg by mouth daily.      mometasone-formoterol (DULERA) 200-5 MCG/ACT AERO Inhale 1 puff into the lungs in the morning and at bedtime. 1 each 3   montelukast (SINGULAIR) 10 MG tablet Take 10 mg by mouth at bedtime.     OVER THE COUNTER MEDICATION Apply 1 application topically daily as needed (knee pain). CBD Cream     OVER THE COUNTER MEDICATION Take 1 tablet by mouth daily. [place]     OVER THE COUNTER MEDICATION Take 1 tablet by mouth daily. Super beets     zinc sulfate 220 (50 Zn) MG capsule Take 40 mg by mouth daily.     No current facility-administered medications for this visit.    Allergies  Allergen Reactions   Etodolac Diarrhea and Nausea And Vomiting   Ciprofloxacin Rash    Social History   Socioeconomic History   Marital status: Married    Spouse name: Not on file   Number of children: Not on file   Years of education: Not on file   Highest education level: Not on file  Occupational History   Not on file  Tobacco Use   Smoking status: Former    Current packs/day: 0.00    Types: Cigarettes    Quit date: 03/27/2017    Years since quitting: 5.4   Smokeless tobacco: Never  Vaping  Use   Vaping status: Never Used  Substance and Sexual Activity   Alcohol use: Not Currently   Drug use: Not Currently   Sexual activity: Not on file  Other Topics Concern   Not on file  Social History Narrative   Not on file   Social Determinants of Health   Financial Resource Strain: Not on file  Food Insecurity: Not on file  Transportation Needs: Not on file  Physical Activity: Not on file  Stress: Not on file  Social Connections: Not on file  Intimate Partner Violence: Not on file    Family History  Problem Relation Age of Onset   Cancer Mother    Heart disease Father    Breast cancer Half-Sister     Review of Systems:  As stated in the HPI and otherwise negative.   There were no vitals taken for this visit.  Physical Examination: General: Well developed, well nourished, NAD  HEENT: OP  clear, mucus membranes moist  SKIN: warm, dry. No rashes. Neuro: No focal deficits  Musculoskeletal: Muscle strength 5/5 all ext  Psychiatric: Mood and affect normal  Neck: No JVD, no carotid bruits, no thyromegaly, no lymphadenopathy.  Lungs:Clear bilaterally, no wheezes, rhonci, crackles Cardiovascular: Regular rate and rhythm. No murmurs, gallops or rubs. Abdomen:Soft. Bowel sounds present. Non-tender.  Extremities: No lower extremity edema. Pulses are 2 + in the bilateral DP/PT.  EKG:  EKG {ACTION; IS/IS [id number]} ordered today. The ekg ordered today demonstrates ***  Recent Labs: No results found for requested labs within last 365 days.   Lipid Panel No results found for: "CHOL", "TRIG", "HDL", "CHOLHDL", "VLDL", "LDLCALC", "LDLDIRECT"   Wt Readings from Last 3 Encounters:  07/15/22 93.9 kg  06/03/22 102.4 kg  05/28/22 102.4 kg    Assessment and Plan:   1.   Labs/ tests ordered today include:  No orders of the defined types were placed in this encounter.  Disposition:   F/U with me in ***   Signed, Verne Carrow, MD, [facility] 09/15/2022 8:10 AM    [facility] [address] [place], [place], [place]  [address] Phone: [phone]; Fax: [phone]

## 2022-09-16 ENCOUNTER — Ambulatory Visit: Payer: Medicaid Other

## 2022-10-17 DIAGNOSIS — M25562 Pain in left knee: Secondary | ICD-10-CM | POA: Diagnosis not present

## 2022-10-17 DIAGNOSIS — M1711 Unilateral primary osteoarthritis, right knee: Secondary | ICD-10-CM | POA: Diagnosis not present

## 2022-10-17 DIAGNOSIS — M1712 Unilateral primary osteoarthritis, left knee: Secondary | ICD-10-CM | POA: Diagnosis not present

## 2022-10-17 DIAGNOSIS — M170 Bilateral primary osteoarthritis of knee: Secondary | ICD-10-CM | POA: Diagnosis not present

## 2022-10-17 DIAGNOSIS — M25561 Pain in right knee: Secondary | ICD-10-CM | POA: Diagnosis not present

## 2022-10-29 ENCOUNTER — Ambulatory Visit: Payer: Medicaid Other | Attending: Cardiovascular Disease

## 2022-10-29 ENCOUNTER — Encounter: Payer: Self-pay | Admitting: Internal Medicine

## 2022-10-29 ENCOUNTER — Ambulatory Visit: Payer: Medicaid Other | Attending: Cardiovascular Disease | Admitting: Internal Medicine

## 2022-10-29 VITALS — BP 130/86 | HR 90 | Ht 59.0 in | Wt 225.0 lb

## 2022-10-29 DIAGNOSIS — R0602 Shortness of breath: Secondary | ICD-10-CM

## 2022-10-29 DIAGNOSIS — Z8249 Family history of ischemic heart disease and other diseases of the circulatory system: Secondary | ICD-10-CM | POA: Diagnosis not present

## 2022-10-29 DIAGNOSIS — E7800 Pure hypercholesterolemia, unspecified: Secondary | ICD-10-CM

## 2022-10-29 NOTE — Patient Instructions (Signed)
Medication Instructions:   *If you need a refill on your cardiac medications before your next appointment, please call your pharmacy*   Lab Work: Nmr, apo b, lipo a, hgba1c today b If you have labs (blood work) drawn today and your tests are completely normal, you will receive your results only by: MyChart Message (if you have MyChart) OR A paper copy in the mail If you have any lab test that is abnormal or we need to change your treatment, we will call you to review the results.   Testing/Procedures: Lauren Nicholson- Long Term Monitor Instructions  Your physician has requested you wear a ZIO patch monitor for 14 days.  This is a single patch monitor. [employer] supplies one patch monitor per enrollment. Additional stickers are not available. Please do not apply patch if you will be having a Nuclear Stress Test,  Echocardiogram, Cardiac CT, MRI, or Chest Xray during the period you would be wearing the  monitor. The patch cannot be worn during these tests. You cannot remove and re-apply the  ZIO XT patch monitor.  Your ZIO patch monitor will be mailed 3 day USPS to your address on file. It may take 3-5 days  to receive your monitor after you have been enrolled.  Once you have received your monitor, please review the enclosed instructions. Your monitor  has already been registered assigning a specific monitor serial # to you.  Billing and Patient Assistance Program Information  We have supplied [employer] with any of your insurance information on file for billing purposes. [employer] offers a sliding scale Patient Assistance Program for patients that do not have  insurance, or whose insurance does not completely cover the cost of the ZIO monitor.  You must apply for the Patient Assistance Program to qualify for this discounted rate.  To apply, please call [employer] at [phone], select option 4, select option 2, ask to apply for  Patient Assistance Program. Lauren Nicholson will ask your household income, and  how many people  are in your household. They will quote your out-of-pocket cost based on that information.  [employer] will also be able to set up a 68-month, interest-free payment plan if needed.  Applying the monitor   Shave hair from upper left chest.  Hold abrader disc by orange tab. Rub abrader in 40 strokes over the upper left chest as  indicated in your monitor instructions.  Clean area with 4 enclosed alcohol pads. Let dry.  Apply patch as indicated in monitor instructions. Patch will be placed under collarbone on left  side of chest with arrow pointing upward.  Rub patch adhesive wings for 2 minutes. Remove white label marked "1". Remove the white  label marked "2". Rub patch adhesive wings for 2 additional minutes.  While looking in a mirror, press and release button in center of patch. A small green light will  flash 3-4 times. This will be your only indicator that the monitor has been turned on.  Do not shower for the first 24 hours. You may shower after the first 24 hours.  Press the button if you feel a symptom. You will hear a small click. Record Date, Time and  Symptom in the Patient Logbook.  When you are ready to remove the patch, follow instructions on the last 2 pages of Patient  Logbook. Stick patch monitor onto the last page of Patient Logbook.  Place Patient Logbook in the blue and white box. Use locking tab on box and tape box closed  securely.  The blue and white box has prepaid postage on it. Please place it in the mailbox as  soon as possible. Your physician should have your test results approximately 7 days after the  monitor has been mailed back to [facility].  Call [facility] Customer Care at [phone] if you have questions regarding  your ZIO XT patch monitor. Call them immediately if you see an orange light blinking on your  monitor.  If your monitor falls off in less than 4 days, contact our Monitor department at [phone].  If your monitor  becomes loose or falls off after 4 days call [employer] at [phone] for  suggestions on securing your monitor   Your physician has requested that you have an echocardiogram. Echocardiography is a painless test that uses sound waves to create images of your heart. It provides your doctor with information about the size and shape of your heart and how well your heart's chambers and valves are working. This procedure takes approximately one hour. There are no restrictions for this procedure. Please do NOT wear cologne, perfume, aftershave, or lotions (deodorant is allowed). Please arrive 15 minutes prior to your appointment time.    Follow-Up: At [facility], you and your health needs are our priority.  As part of our continuing mission to provide you with exceptional heart care, we have created designated Provider Care Teams.  These Care Teams include your primary Cardiologist (physician) and Advanced Practice Providers (APPs -  Physician Assistants and Nurse Practitioners) who all work together to provide you with the care you need, when you need it.  We recommend signing up for the patient portal called "MyChart".  Sign up information is provided on this After Visit Summary.  MyChart is used to connect with patients for Virtual Visits (Telemedicine).  Patients are able to view lab/test results, encounter notes, upcoming appointments, etc.  Non-urgent messages can be sent to your provider as well.   To learn more about what you can do with MyChart, go to [email or web address].    Will plan follow up after testing.

## 2022-10-29 NOTE — Progress Notes (Unsigned)
Enrolled for [employer] to mail a ZIO XT long term holter monitor to the patients address on file.  

## 2022-10-29 NOTE — Progress Notes (Signed)
Cardiology Office Note   Date:  10/29/2022   ID:  Lauren Nicholson, DOB 16-Oct-1960, MRN [id number]  PCP:  Alfredo Martinez, MD  Cardiologist:   Dietrich Pates, MD    Pt presents for cardiac risk assessment    History of Present Illness: Lauren Nicholson is a 62 y.o. female with a history of HTN, asthma   No known CAD   Referred because of strong FHx of cardiac problmes   Mother died of  MI/VF at age 35   Sister had MI in 67s  ? If ingestion of substance   Maternal aunt had MI in 42s   The pt denies CP   Says her breathing is OK   She is not too active   Limited by knees   Does do work outs in the pool  Tried to do Ca score CT but go clausterphobic   Could not do it     Current Meds  Medication Sig   Acetylcysteine (NAC) 600 MG CAPS Take 600 mg by mouth daily.   albuterol (PROVENTIL HFA;VENTOLIN HFA) 108 (90 BASE) MCG/ACT inhaler Inhale 2 puffs into the lungs every 4 (four) hours as needed for wheezing or shortness of breath. (Patient taking differently: Inhale 2 puffs into the lungs every 4 (four) hours as needed for wheezing or shortness of breath. Last used: 300pm.)   albuterol (PROVENTIL) (2.5 MG/3ML) 0.083% nebulizer solution Take 3 mLs (2.5 mg total) by nebulization every 6 (six) hours as needed for wheezing or shortness of breath.   brompheniramine-pseudoephedrine-DM 30-2-10 MG/5ML syrup Take 5 mLs by mouth 4 (four) times daily as needed.   cetirizine (ZYRTEC) 10 MG tablet Take 10 mg by mouth daily.   Cholecalciferol (VITAMIN D-3) 125 MCG (5000 UT) TABS Take 5,000 Units by mouth daily.   diclofenac Sodium (VOLTAREN) 1 % GEL Apply 2 g topically 4 (four) times daily.   meloxicam (MOBIC) 7.5 MG tablet Take 7.5 mg by mouth daily.   montelukast (SINGULAIR) 10 MG tablet Take 10 mg by mouth at bedtime.   OVER THE COUNTER MEDICATION Apply 1 application topically daily as needed (knee pain). CBD Cream   OVER THE COUNTER MEDICATION Take 1 tablet by mouth daily. Sea Moss   OVER THE COUNTER  MEDICATION Take 1 tablet by mouth daily. Super beets   zinc sulfate 220 (50 Zn) MG capsule Take 40 mg by mouth daily.     Allergies:   Etodolac and Ciprofloxacin   Past Medical History:  Diagnosis Date   ADHD    Asthma    Diverticulitis    Fibrocystic breast    Fibroids    uterine fibroids with cyst   Neck pain    radiates to LUE   OA (osteoarthritis)    B/L knees    Past Surgical History:  Procedure Laterality Date   COLONOSCOPY     RADIOLOGY WITH ANESTHESIA N/A 07/28/2017   Procedure: MRI OF CERVICAL SPINE WITHOUT CONTRAST;  Surgeon: Radiologist, Medication, MD;  Location: [facility] OR;  Service: Radiology;  Laterality: N/A;     Social History:  The patient  reports that she quit smoking about 5 years ago. Her smoking use included cigarettes. She has quit using smokeless tobacco. She reports that she does not currently use alcohol. She reports that she does not currently use drugs.   Family History:  The patient's family history includes Breast cancer in her half-sister; Cancer in her mother; Heart disease in her father; Heart murmur in her maternal  grandmother.    ROS:  Please see the history of present illness. All other systems are reviewed and  Negative to the above problem except as noted.    PHYSICAL EXAM: VS:  BP 130/86   Pulse 90   Ht 4\' 11"  (1.499 m)   Wt 225 lb (102.1 kg)   SpO2 97%   BMI 45.44 kg/m   GEN: Morbidly obese 62 yo in no acute distress  HEENT: normal  Neck: no JVD, no carotid bruits Cardiac: RRR; no murmurs  No LE edema  Respiratory:  clear to auscultation bilaterally, GI: Obese   Nontender      EKG:  EKG is ordered today.  NSR 88 bpm   Nonspeicif ST wave changes    Lipid Panel No results found for: "CHOL", "TRIG", "HDL", "CHOLHDL", "VLDL", "LDLCALC", "LDLDIRECT"    Wt Readings from Last 3 Encounters:  10/29/22 225 lb (102.1 kg)  07/15/22 207 lb (93.9 kg)  06/03/22 225 lb 12.8 oz (102.4 kg)      ASSESSMENT AND PLAN:  1   Cardiac risk  assessment  PT with strong FHx of MI/VF  Some members were smokers  SHe hs risks (HTN, Obesity)    Unfortunately could not tolerate procedure for Ca score CT WIll set up for echo Will get lipomed, Lpa and Apo B Consider other form of testing   Assume has CAD  2  HTN  BP is OK today  Will need to follow    3  Metabolic   will repeat A1C   Reviewed diet  Cut out ultraprocessed foods   Recomm minimally processed foods Lots of veggies    Follow up based on test results    Current medicines are reviewed at length with the patient today.  The patient does not have concerns regarding medicines.  Signed, Dietrich Pates, MD  10/29/2022 3:27 PM    [facility] [address] [place], [place], [place]  [address] Phone: [phone]; Fax: [phone]

## 2022-10-30 LAB — HEMOGLOBIN A1C
Est. average glucose Bld gHb Est-mCnc: 131 mg/dL
Hgb A1c MFr Bld: 6.2 % — ABNORMAL HIGH (ref 4.8–5.6)

## 2022-10-30 LAB — NMR, LIPOPROFILE
Cholesterol, Total: 186 mg/dL (ref 100–199)
HDL Particle Number: 23.3 umol/L — ABNORMAL LOW (ref >=30.5)
HDL-C: 45 mg/dL (ref >39)
LDL Particle Number: 1515 nmol/L — ABNORMAL HIGH (ref <1000)
LDL Size: 20.9 nm (ref >20.5)
LDL-C (NIH Calc): 130 mg/dL — ABNORMAL HIGH (ref 0–99)
LP-IR Score: 31 (ref <=45)
Small LDL Particle Number: 511 nmol/L (ref <=527)
Triglycerides: 59 mg/dL (ref 0–149)

## 2022-10-30 LAB — APOLIPOPROTEIN B: Apolipoprotein B: 109 mg/dL — ABNORMAL HIGH (ref <90)

## 2022-10-30 LAB — LIPOPROTEIN A (LPA): Lipoprotein (a): 221.6 nmol/L — ABNORMAL HIGH (ref <75.0)

## 2022-11-06 ENCOUNTER — Ambulatory Visit: Payer: Medicaid Other | Attending: Internal Medicine

## 2022-11-06 ENCOUNTER — Other Ambulatory Visit: Payer: Self-pay | Admitting: *Deleted

## 2022-11-06 ENCOUNTER — Telehealth: Payer: Self-pay | Admitting: *Deleted

## 2022-11-06 DIAGNOSIS — Z8249 Family history of ischemic heart disease and other diseases of the circulatory system: Secondary | ICD-10-CM

## 2022-11-06 DIAGNOSIS — R0602 Shortness of breath: Secondary | ICD-10-CM

## 2022-11-06 NOTE — Progress Notes (Unsigned)
Enrolled for [employer] to ship a long term holter monitor with Hydrocolloid strips.  Patient allergic to ZIO XT patch.

## 2022-11-06 NOTE — Telephone Encounter (Signed)
Patient had a skin reaction to ZIO patch monitor within 24 hours and removed monitor.  Instructed patient to mail monitor back to [facility] and we will cancel the order and associated charges.  Patient enrolled with [facility] [employer] for a long term holter monitor and requested sensitive skin Hydrocollid strip electrodes.

## 2022-11-11 DIAGNOSIS — Z8249 Family history of ischemic heart disease and other diseases of the circulatory system: Secondary | ICD-10-CM | POA: Diagnosis not present

## 2022-11-11 DIAGNOSIS — R0602 Shortness of breath: Secondary | ICD-10-CM | POA: Diagnosis not present

## 2022-11-13 ENCOUNTER — Telehealth: Payer: Self-pay

## 2022-11-13 ENCOUNTER — Encounter (HOSPITAL_COMMUNITY): Payer: Self-pay | Admitting: Radiology

## 2022-11-13 ENCOUNTER — Ambulatory Visit (HOSPITAL_COMMUNITY): Payer: Medicaid Other | Attending: Cardiovascular Disease

## 2022-11-13 DIAGNOSIS — R0602 Shortness of breath: Secondary | ICD-10-CM | POA: Diagnosis not present

## 2022-11-13 DIAGNOSIS — E7800 Pure hypercholesterolemia, unspecified: Secondary | ICD-10-CM | POA: Insufficient documentation

## 2022-11-13 DIAGNOSIS — Z8249 Family history of ischemic heart disease and other diseases of the circulatory system: Secondary | ICD-10-CM | POA: Insufficient documentation

## 2022-11-13 LAB — ECHOCARDIOGRAM COMPLETE
Area-P 1/2: 4.41 cm2
S' Lateral: 3.2 cm

## 2022-11-13 NOTE — Telephone Encounter (Signed)
-----   Message from Ilion sent at 11/02/2022 11:02 PM EDT ----- Lipids :   LDL is high at 130    Higher particle number  Lpa is very high     She could not tolerate CT for Ca score  With numbers and FHx I would treat risks aagressively  I would start on 20 Crestor    Follow up lipomed in 10 wks with liver panel A1C is mildly elevated  at 6.2    Work on diet as discussed  Minimally processed foods   Lots of veggies   Minimze processed carbs and sugars

## 2022-11-13 NOTE — Telephone Encounter (Signed)
I spoke with the pt re: her lab results and Dr Tenny Craw' recommendations an she strongly declined.. she says she will not take any meds... she will work on her diet.Marland Kitchen asked to never be called again with new meds without giving her time to manage her diet on her own... she declined any plans for future labs at this point and says she will call when she is ready.

## 2022-12-11 ENCOUNTER — Other Ambulatory Visit: Payer: Self-pay

## 2022-12-11 MED ORDER — CETIRIZINE HCL 10 MG PO TABS: 10.0000 mg | ORAL_TABLET | Freq: Every day | ORAL | 0 refills | Status: AC

## 2022-12-25 ENCOUNTER — Ambulatory Visit: Payer: Medicaid Other | Admitting: Internal Medicine

## 2023-01-26 DIAGNOSIS — M67912 Unspecified disorder of synovium and tendon, left shoulder: Secondary | ICD-10-CM | POA: Diagnosis not present

## 2023-01-26 DIAGNOSIS — M67911 Unspecified disorder of synovium and tendon, right shoulder: Secondary | ICD-10-CM | POA: Diagnosis not present

## 2023-01-27 ENCOUNTER — Encounter: Payer: Self-pay | Admitting: Student

## 2023-01-29 ENCOUNTER — Telehealth: Payer: Medicaid Other | Admitting: Physician Assistant

## 2023-01-29 DIAGNOSIS — B9689 Other specified bacterial agents as the cause of diseases classified elsewhere: Secondary | ICD-10-CM | POA: Diagnosis not present

## 2023-01-29 DIAGNOSIS — J208 Acute bronchitis due to other specified organisms: Secondary | ICD-10-CM | POA: Diagnosis not present

## 2023-01-29 MED ORDER — PREDNISONE 20 MG PO TABS: 40.0000 mg | ORAL_TABLET | Freq: Every day | ORAL | 0 refills | Status: AC

## 2023-01-29 MED ORDER — BENZONATATE 100 MG PO CAPS: 100.0000 mg | ORAL_CAPSULE | Freq: Three times a day (TID) | ORAL | 0 refills | Status: AC | PRN

## 2023-01-29 MED ORDER — AZITHROMYCIN 250 MG PO TABS: ORAL_TABLET | ORAL | 0 refills | Status: AC

## 2023-01-29 NOTE — Progress Notes (Signed)
E-Visit for Cough   We are sorry that you are not feeling well.  Here is how we plan to help!  Based on your presentation I believe you most likely have A cough due to bacteria.  When patients have a fever and a productive cough with a change in color or increased sputum production, we are concerned about bacterial bronchitis.  If left untreated it can progress to pneumonia.  If your symptoms do not improve with your treatment plan it is important that you contact your provider.   I have prescribed Azithromyin 250 mg: two tablets now and then one tablet daily for 4 additonal days    In addition you may use A non-prescription cough medication called Mucinex DM: take 2 tablets every 12 hours. and A prescription cough medication called Tessalon Perles 100mg . You may take 1-2 capsules every 8 hours as needed for your cough.  I have also prescribed Prednisone 20mg  Take 2 tablets (40mg ) daily for 5 days.  From your responses in the eVisit questionnaire you describe inflammation in the upper respiratory tract which is causing a significant cough.  This is commonly called Bronchitis and has four common causes:   Allergies Viral Infections Acid Reflux Bacterial Infection Allergies, viruses and acid reflux are treated by controlling symptoms or eliminating the cause. An example might be a cough caused by taking certain blood pressure medications. You stop the cough by changing the medication. Another example might be a cough caused by acid reflux. Controlling the reflux helps control the cough.  USE OF BRONCHODILATOR ("RESCUE") INHALERS: There is a risk from using your bronchodilator too frequently.  The risk is that over-reliance on a medication which only relaxes the muscles surrounding the breathing tubes can reduce the effectiveness of medications prescribed to reduce swelling and congestion of the tubes themselves.  Although you feel brief relief from the bronchodilator inhaler, your asthma may  actually be worsening with the tubes becoming more swollen and filled with mucus.  This can delay other crucial treatments, such as oral steroid medications. If you need to use a bronchodilator inhaler daily, several times per day, you should discuss this with your provider.  There are probably better treatments that could be used to keep your asthma under control.     HOME CARE Only take medications as instructed by your medical team. Complete the entire course of an antibiotic. Drink plenty of fluids and get plenty of rest. Avoid close contacts especially the very young and the elderly Cover your mouth if you cough or cough into your sleeve. Always remember to wash your hands A steam or ultrasonic humidifier can help congestion.   GET HELP RIGHT AWAY IF: You develop worsening fever. You become short of breath You cough up blood. Your symptoms persist after you have completed your treatment plan MAKE SURE YOU  Understand these instructions. Will watch your condition. Will get help right away if you are not doing well or get worse.    Thank you for choosing an e-visit.  Your e-visit answers were reviewed by a board certified advanced clinical practitioner to complete your personal care plan. Depending upon the condition, your plan could have included both over the counter or prescription medications.  Please review your pharmacy choice. Make sure the pharmacy is open so you can pick up prescription now. If there is a problem, you may contact your provider through [employer] and have the prescription routed to another pharmacy.  Your safety is important to [place].  If you have drug allergies check your prescription carefully.   For the next 24 hours you can use MyChart to ask questions about today's visit, request a non-urgent call back, or ask for a work or school excuse. You will get an email in the next two days asking about your experience. I hope that your e-visit has been valuable  and will speed your recovery.   I have spent 5 minutes in review of e-visit questionnaire, review and updating patient chart, medical decision making and response to patient.   Margaretann Loveless, PA-C

## 2023-02-27 ENCOUNTER — Telehealth: Payer: Medicaid Other | Admitting: Family Medicine

## 2023-02-27 DIAGNOSIS — J329 Chronic sinusitis, unspecified: Secondary | ICD-10-CM

## 2023-02-27 NOTE — Progress Notes (Signed)
 Because you were just treated last month for infection with prednisone , zpack your condition warrants further evaluation and I recommend that you be seen in a face to face visit at your PCP office or local urgent care.   NOTE: There will be NO CHARGE for this eVisit   If you are having a true medical emergency please call 911.

## 2023-03-03 ENCOUNTER — Ambulatory Visit: Payer: Medicaid Other | Admitting: Family Medicine

## 2023-03-09 ENCOUNTER — Telehealth: Payer: Self-pay

## 2023-03-09 NOTE — Telephone Encounter (Signed)
 Scheduled virtual visit with Dr. Barb Merino on 03/11/23, as PCP does not have availability until next month.   Veronda Prude, RN

## 2023-03-09 NOTE — Telephone Encounter (Signed)
 Patient calls nurse line regarding renewal on incontinence supplies.   She states that [employer] is going to fax us  the paperwork.   Advised that per insurance guidelines, she would need a visit discussing the need for supplies.   Patient is asking if this can be virtual as she is taking care of her niece that has a broken femur.   Will forward to PCP to see if virtual is appropriate.   Chiquita JAYSON English, RN

## 2023-03-10 NOTE — Progress Notes (Deleted)
 [facility] Telemedicine Visit  Patient consented to have virtual visit and was identified by name and date of birth. Method of visit: {TELEPHONE VS ZOXWR:60454}  Encounter participants: Patient: Lauren Nicholson - located at *** Provider: Edison Gore - located at *** Others (if applicable): ***  Chief Complaint: Discuss incontinence supplies***  HPI:  ***  ROS: per HPI  Pertinent PMHx: ***  Exam:  There were no vitals taken for this visit.  Respiratory: ***  Assessment/Plan:  No problem-specific Assessment & Plan notes found for this encounter.    Time spent during visit with patient: *** minutes

## 2023-03-11 ENCOUNTER — Telehealth: Payer: Medicaid Other | Admitting: Family Medicine

## 2023-04-22 ENCOUNTER — Telehealth: Payer: Self-pay

## 2023-04-22 NOTE — Telephone Encounter (Signed)
 Contacted the patient to schedule her virtual appointment for her incontinence supplies, patient stated that she no longer need the supplies.

## 2023-04-22 NOTE — Telephone Encounter (Signed)
-----   Message from [employer] sent at 04/22/2023 11:09 AM EST ----- Needs virtual visit for incontinence supplies, can be with any blue hall member.

## 2023-05-10 ENCOUNTER — Telehealth: Admitting: Physician Assistant

## 2023-05-10 DIAGNOSIS — J069 Acute upper respiratory infection, unspecified: Secondary | ICD-10-CM

## 2023-05-10 NOTE — Progress Notes (Signed)
  Because of the duration of your symptoms, I feel your condition warrants further evaluation and I recommend that you be seen in a face-to-face visit.   NOTE: There will be NO CHARGE for this E-Visit   If you are having a true medical emergency, please call 911.     For an urgent face to face visit, [facility] has multiple urgent care centers for your convenience.  Click the link below for the full list of locations and hours, walk-in wait times, appointment scheduling options and driving directions:  Urgent Care - [place], [place], [place], [place], [place], [place]  [place]     Your MyChart E-visit questionnaire answers were reviewed by a board certified advanced clinical practitioner to complete your personal care plan based on your specific symptoms.    Thank you for using e-Visits.

## 2023-05-21 NOTE — Telephone Encounter (Signed)
 Form printed and placed in PCP box.   Veronda Prude, RN

## 2023-06-22 DIAGNOSIS — F4310 Post-traumatic stress disorder, unspecified: Secondary | ICD-10-CM | POA: Diagnosis not present

## 2023-06-22 DIAGNOSIS — Z630 Problems in relationship with spouse or partner: Secondary | ICD-10-CM | POA: Diagnosis not present

## 2023-06-22 DIAGNOSIS — F331 Major depressive disorder, recurrent, moderate: Secondary | ICD-10-CM | POA: Diagnosis not present

## 2023-06-29 DIAGNOSIS — Z630 Problems in relationship with spouse or partner: Secondary | ICD-10-CM | POA: Diagnosis not present

## 2023-06-29 DIAGNOSIS — F331 Major depressive disorder, recurrent, moderate: Secondary | ICD-10-CM | POA: Diagnosis not present

## 2023-06-29 DIAGNOSIS — F4310 Post-traumatic stress disorder, unspecified: Secondary | ICD-10-CM | POA: Diagnosis not present

## 2023-06-29 DIAGNOSIS — F430 Acute stress reaction: Secondary | ICD-10-CM | POA: Diagnosis not present

## 2023-07-09 DIAGNOSIS — F331 Major depressive disorder, recurrent, moderate: Secondary | ICD-10-CM | POA: Diagnosis not present

## 2023-07-09 DIAGNOSIS — F430 Acute stress reaction: Secondary | ICD-10-CM | POA: Diagnosis not present

## 2023-07-09 DIAGNOSIS — F4310 Post-traumatic stress disorder, unspecified: Secondary | ICD-10-CM | POA: Diagnosis not present

## 2023-07-09 DIAGNOSIS — Z630 Problems in relationship with spouse or partner: Secondary | ICD-10-CM | POA: Diagnosis not present

## 2023-07-16 ENCOUNTER — Encounter: Payer: Self-pay | Admitting: Student

## 2023-07-16 ENCOUNTER — Ambulatory Visit: Admitting: Student

## 2023-07-16 VITALS — BP 145/95 | HR 87 | Ht 59.0 in | Wt 225.8 lb

## 2023-07-16 DIAGNOSIS — G8929 Other chronic pain: Secondary | ICD-10-CM | POA: Diagnosis not present

## 2023-07-16 DIAGNOSIS — M25511 Pain in right shoulder: Secondary | ICD-10-CM

## 2023-07-16 DIAGNOSIS — Z0000 Encounter for general adult medical examination without abnormal findings: Secondary | ICD-10-CM | POA: Diagnosis not present

## 2023-07-16 DIAGNOSIS — R7303 Prediabetes: Secondary | ICD-10-CM

## 2023-07-16 LAB — POCT GLYCOSYLATED HEMOGLOBIN (HGB A1C): HbA1c, POC (prediabetic range): 6 % (ref 5.7–6.4)

## 2023-07-16 NOTE — Patient Instructions (Addendum)
 It was great to see you today! Thank you for choosing [facility] for your primary care.  Today we addressed: We will check some labs today I will refer you to sports medicine  I want to see you in 4 weeks  Work on dietary habits   Blood Pressure Record Sheet To take your blood pressure, you will need a blood pressure machine. You can buy a blood pressure machine (blood pressure monitor) at your clinic, drug store, or online. When choosing one, consider: An automatic monitor that has an arm cuff. A cuff that wraps snugly around your upper arm. You should be able to fit only one finger between your arm and the cuff. A device that stores blood pressure reading results. Do not choose a monitor that measures your blood pressure from your wrist or finger. Follow your health care provider's instructions for how to take your blood pressure. To use this form: Take your blood pressure medications every day These measurements should be taken when you have been at rest for at least 10-15 min Take at least 2 readings with each blood pressure check. This makes sure the results are correct. Wait 1-2 minutes between measurements. Write down the results in the spaces on this form. Keep in mind it should always be recorded systolic over diastolic. Both numbers are important.  Repeat this every day for 2-3 weeks, or as told by your health care provider.  Make a follow-up appointment with your health care provider to discuss the results.  Blood Pressure Log Date Medications taken? (Y/N) Blood Pressure Time of Day                                                                                                         If you haven't already, sign up for My Chart to have easy access to your labs results, and communication with your primary care physician.   Please arrive 15 minutes before your appointment to ensure smooth check in process.  We appreciate your efforts in making  this happen.  Thank you for allowing me to participate in your care, Ernestina Headland, MD 07/16/2023, 10:12 AM PGY-3, [facility]

## 2023-07-16 NOTE — Progress Notes (Signed)
    SUBJECTIVE:   CHIEF COMPLAINT / HPI:   Routine Follow Up  Shoulder Pain: Lauren Nicholson is a 63 year old female who presents for a wellness check and evaluation of shoulder pain.  She has experienced right shoulder pain since November, initially suspected to be a frozen shoulder after pulling herself up in a van. An x-ray showed an inflamed rotator cuff per patient. The pain is severe and limits arm movement. She uses a rolling tool under her arm for relief and takes naproxen 500 mg as needed. She is hesitant about injections and has not returned to the orthopedic provider due to a negative experience.  She feels stressed and has been overeating, consuming both healthy and processed foods. She lost seven pounds but regained them. She is concerned about her blood pressure readings and has not been taking any antihypertensive medication. She experiences slight headaches, possibly due to allergies, but no dizziness.  She experiences acid reflux, especially when eating late, causing her to wake up and sit up for a couple of hours. She denies smoking and has not been using her inhalers regularly. She has a nebulizer for albuterol  but has not used it recently.   PERTINENT  PMH / PSH: Reviewed   OBJECTIVE:   BP (!) 145/95   Pulse 87   Ht 4\' 11"  (1.499 m)   Wt 225 lb 12.8 oz (102.4 kg)   SpO2 99%   BMI 45.61 kg/m   General: Alert and oriented in no apparent distress Heart: Regular rate and rhythm with no murmurs appreciated Lungs: CTA bilaterally, no wheezing Abdomen: Bowel sounds present, no abdominal pain Skin: Warm and dry Extremities: No lower extremity edema, Limited Active ROM of Right shoulder to 90 degrees, normal with passive ROM, strength intact, posterior TTP   ASSESSMENT/PLAN:   Assessment & Plan Prediabetes A1C check Annual physical exam Annual Examination  See AVS for age appropriate recommendations  PHQ score reviewed and discussed.  BP reviewed and NOT at  goal. Provided BP log to return to me in 4 weeks. No medications presently and declines initiating one until further monitoring at home.   Considered the following items based upon USPSTF recommendations: Diabetes screening: ordered Screening for elevated cholesterol: ordered HIV testing: ordered Hepatitis C: ordered Hepatitis B: na Syphilis if at high risk: na GC/CT na Reviewed risk factors for latent tuberculosis and not indicated Cervical cancer screening: needs new, will perform in 4 weeks  Breast cancer screening: UTD Colorectal cancer screening: FIT testing ordered  Lung cancer screening: discussed. Not indicated  Follow up in 1 month MyChart Activation: Already signed up Chronic right shoulder pain Unknown etiology, frozen shoulder vs rotator cuff injury/inflammation. Patient will see sports med to discuss options. Declines PT for now. Had imaging/US  at [facility] but I am unable to see. May be steroid injection candidate. Consider MR is continues despite conservative treatment.    Ernestina Headland, MD [facility]

## 2023-07-17 ENCOUNTER — Ambulatory Visit: Payer: Self-pay | Admitting: Student

## 2023-07-17 LAB — HIV ANTIBODY (ROUTINE TESTING W REFLEX): HIV Screen 4th Generation wRfx: NONREACTIVE

## 2023-07-17 LAB — COMPREHENSIVE METABOLIC PANEL WITH GFR
ALT: 12 IU/L (ref 0–32)
AST: 17 IU/L (ref 0–40)
Albumin: 4.3 g/dL (ref 3.9–4.9)
Alkaline Phosphatase: 101 IU/L (ref 44–121)
BUN/Creatinine Ratio: 29 — ABNORMAL HIGH (ref 12–28)
BUN: 15 mg/dL (ref 8–27)
Bilirubin Total: 0.3 mg/dL (ref 0.0–1.2)
CO2: 21 mmol/L (ref 20–29)
Calcium: 9.4 mg/dL (ref 8.7–10.3)
Chloride: 103 mmol/L (ref 96–106)
Creatinine, Ser: 0.51 mg/dL — ABNORMAL LOW (ref 0.57–1.00)
Globulin, Total: 2.9 g/dL (ref 1.5–4.5)
Glucose: 74 mg/dL (ref 70–99)
Potassium: 3.9 mmol/L (ref 3.5–5.2)
Sodium: 141 mmol/L (ref 134–144)
Total Protein: 7.2 g/dL (ref 6.0–8.5)
eGFR: 105 mL/min/{1.73_m2} (ref >59)

## 2023-07-17 LAB — CBC WITH DIFFERENTIAL/PLATELET
Basophils Absolute: 0 x10E3/uL (ref 0.0–0.2)
Basos: 0 %
EOS (ABSOLUTE): 0.2 x10E3/uL (ref 0.0–0.4)
Eos: 2 %
Hematocrit: 41.9 % (ref 34.0–46.6)
Hemoglobin: 13.1 g/dL (ref 11.1–15.9)
Immature Grans (Abs): 0 x10E3/uL (ref 0.0–0.1)
Immature Granulocytes: 0 %
Lymphocytes Absolute: 2.6 x10E3/uL (ref 0.7–3.1)
Lymphs: 32 %
MCH: 25.3 pg — ABNORMAL LOW (ref 26.6–33.0)
MCHC: 31.3 g/dL — ABNORMAL LOW (ref 31.5–35.7)
MCV: 81 fL (ref 79–97)
Monocytes Absolute: 0.6 x10E3/uL (ref 0.1–0.9)
Monocytes: 7 %
Neutrophils Absolute: 4.7 x10E3/uL (ref 1.4–7.0)
Neutrophils: 59 %
Platelets: 218 x10E3/uL (ref 150–450)
RBC: 5.18 x10E6/uL (ref 3.77–5.28)
RDW: 13.2 % (ref 11.7–15.4)
WBC: 8.1 x10E3/uL (ref 3.4–10.8)

## 2023-07-17 LAB — LIPID PANEL
Chol/HDL Ratio: 4.5 ratio — ABNORMAL HIGH (ref 0.0–4.4)
Cholesterol, Total: 181 mg/dL (ref 100–199)
HDL: 40 mg/dL (ref >39)
LDL Chol Calc (NIH): 125 mg/dL — ABNORMAL HIGH (ref 0–99)
Triglycerides: 89 mg/dL (ref 0–149)
VLDL Cholesterol Cal: 16 mg/dL (ref 5–40)

## 2023-07-17 LAB — HEPATITIS C ANTIBODY: Hep C Virus Ab: NONREACTIVE

## 2023-07-22 DIAGNOSIS — F4310 Post-traumatic stress disorder, unspecified: Secondary | ICD-10-CM | POA: Diagnosis not present

## 2023-07-22 DIAGNOSIS — Z630 Problems in relationship with spouse or partner: Secondary | ICD-10-CM | POA: Diagnosis not present

## 2023-07-22 DIAGNOSIS — F430 Acute stress reaction: Secondary | ICD-10-CM | POA: Diagnosis not present

## 2023-07-22 DIAGNOSIS — F331 Major depressive disorder, recurrent, moderate: Secondary | ICD-10-CM | POA: Diagnosis not present

## 2023-08-04 ENCOUNTER — Encounter: Payer: Self-pay | Admitting: *Deleted

## 2023-08-05 DIAGNOSIS — F4310 Post-traumatic stress disorder, unspecified: Secondary | ICD-10-CM | POA: Diagnosis not present

## 2023-08-05 DIAGNOSIS — F430 Acute stress reaction: Secondary | ICD-10-CM | POA: Diagnosis not present

## 2023-08-05 DIAGNOSIS — F331 Major depressive disorder, recurrent, moderate: Secondary | ICD-10-CM | POA: Diagnosis not present

## 2023-08-05 DIAGNOSIS — Z630 Problems in relationship with spouse or partner: Secondary | ICD-10-CM | POA: Diagnosis not present

## 2023-08-14 ENCOUNTER — Ambulatory Visit: Admitting: Student

## 2023-09-01 ENCOUNTER — Ambulatory Visit: Admitting: Family Medicine

## 2023-09-21 ENCOUNTER — Other Ambulatory Visit: Payer: Self-pay

## 2023-09-22 MED ORDER — ALBUTEROL SULFATE (2.5 MG/3ML) 0.083% IN NEBU: 2.5000 mg | INHALATION_SOLUTION | Freq: Four times a day (QID) | RESPIRATORY_TRACT | 12 refills | Status: AC | PRN

## 2023-09-22 NOTE — Addendum Note (Signed)
 Addended by: Hezzie Karim C on: 09/22/2023 09:20 AM   Modules accepted: Orders

## 2023-09-22 NOTE — Telephone Encounter (Signed)
 Patient returns call to nurse line regarding denial of refill. Patient reports frustration with refill being denied.   She reports that she needs this medication and Dr. Bryan had been prescribing this for her for years.   Last refill was sent by Urgent Care provider.   She is requesting refill as soon as possible.   Please advise.   Chiquita JAYSON English, RN

## 2023-11-05 DIAGNOSIS — M170 Bilateral primary osteoarthritis of knee: Secondary | ICD-10-CM | POA: Diagnosis not present

## 2023-11-05 NOTE — Progress Notes (Deleted)
    SUBJECTIVE:   CHIEF COMPLAINT / HPI:   *** Pap, mammogram, colonoscopy Cards recommended starting Crestor 20 and f/u lipomed w/ liver panel, pt declined  PERTINENT  PMH / PSH: asthma, OA, hypercholesterolemia  OBJECTIVE:   There were no vitals taken for this visit.  ***  ASSESSMENT/PLAN:   Assessment & Plan      Lauren Prescott, DO [facility]

## 2023-11-06 ENCOUNTER — Ambulatory Visit: Admitting: Family Medicine

## 2023-11-25 DIAGNOSIS — H40033 Anatomical narrow angle, bilateral: Secondary | ICD-10-CM | POA: Diagnosis not present

## 2023-11-25 DIAGNOSIS — H1013 Acute atopic conjunctivitis, bilateral: Secondary | ICD-10-CM | POA: Diagnosis not present

## 2023-12-20 NOTE — Progress Notes (Deleted)
Pt is a no show for appt

## 2023-12-25 ENCOUNTER — Ambulatory Visit: Admitting: Internal Medicine

## 2023-12-27 NOTE — Progress Notes (Deleted)
  Pt is  no show for appt  

## 2023-12-31 ENCOUNTER — Ambulatory Visit: Admitting: Internal Medicine

## 2024-03-10 NOTE — Progress Notes (Unsigned)
 No show for appointment

## 2024-03-11 ENCOUNTER — Ambulatory Visit: Admitting: Internal Medicine

## 2024-04-10 IMAGING — MG MM DIGITAL SCREENING BILAT W/ TOMO AND CAD
8 of 14 series · 8 of 40 positions shown · non-contrast
Comparison: Previous exam(s).

CLINICAL DATA: Screening.

EXAM:
DIGITAL SCREENING BILATERAL MAMMOGRAM WITH TOMOSYNTHESIS AND CAD
TECHNIQUE: Bilateral screening digital craniocaudal and mediolateral oblique
mammograms were obtained. Bilateral screening digital breast
tomosynthesis was performed. The images were evaluated with
computer-aided detection.

[R CC synth-2D]
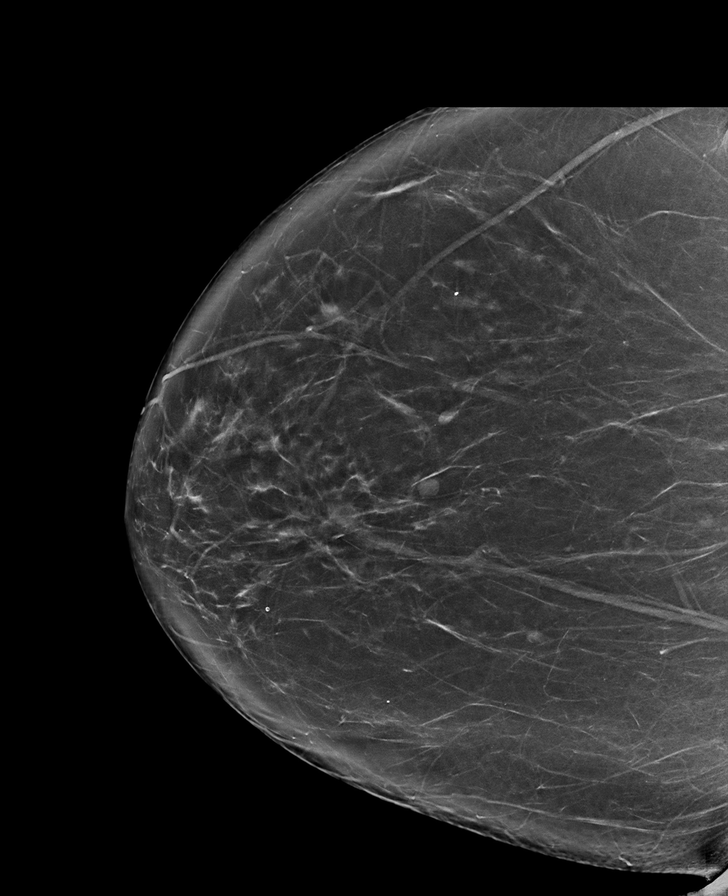

[R AT synth-2D]
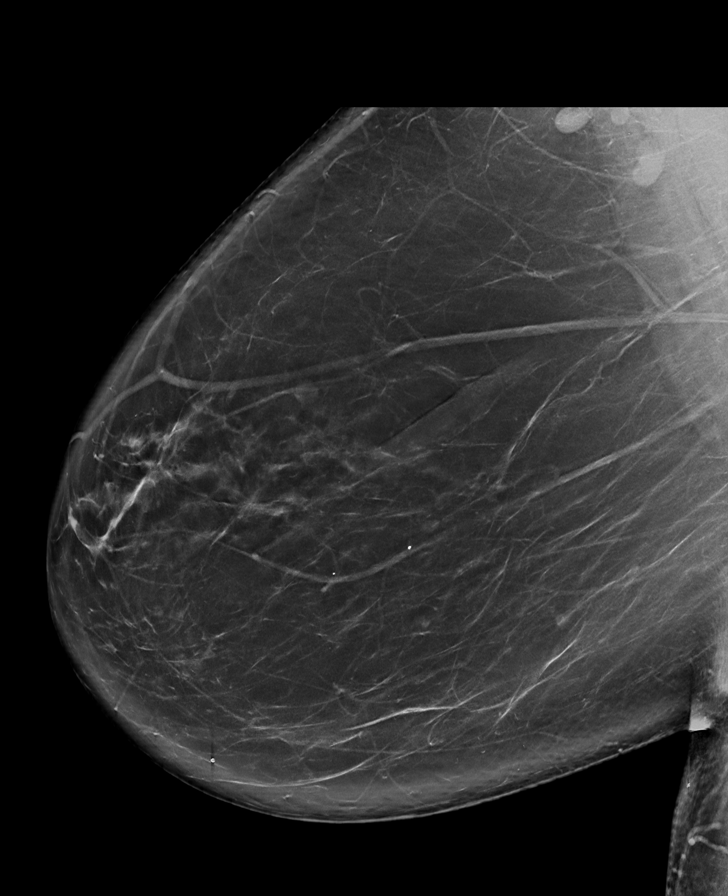

[L CC synth-2D]
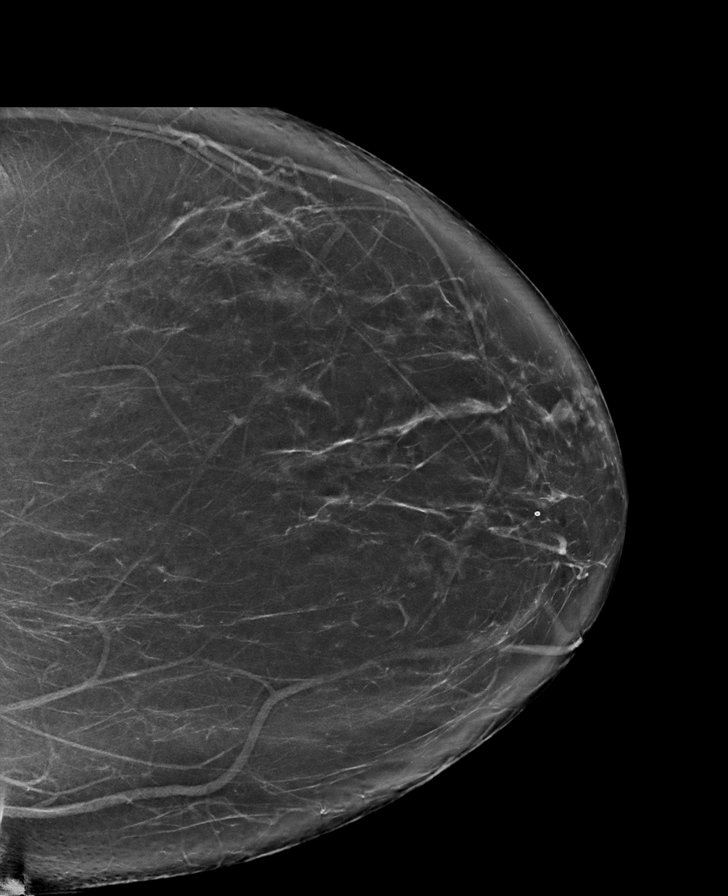

[L CV synth-2D]
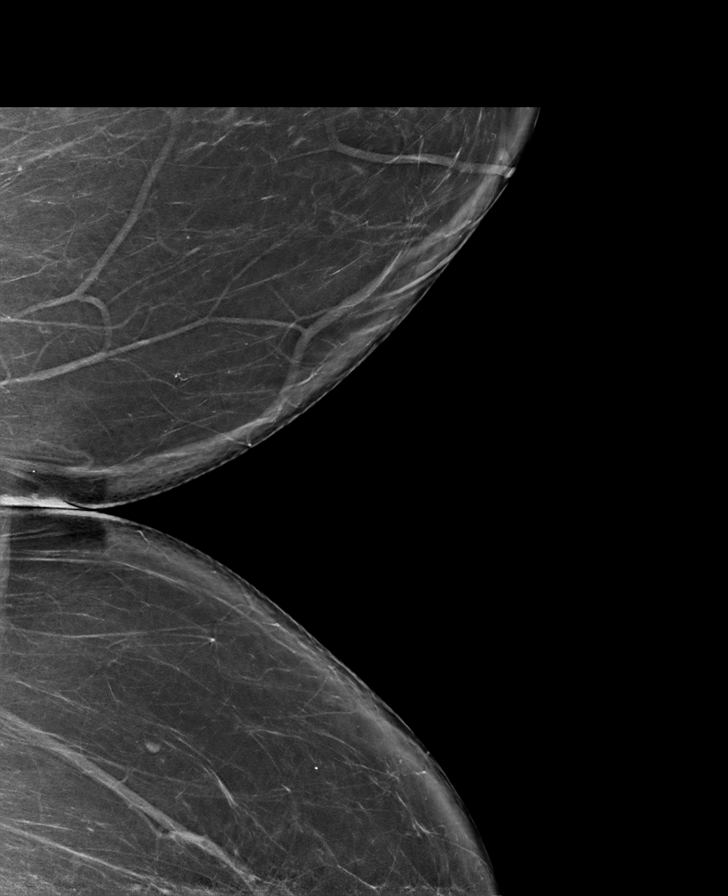

[R MLO synth-2D]
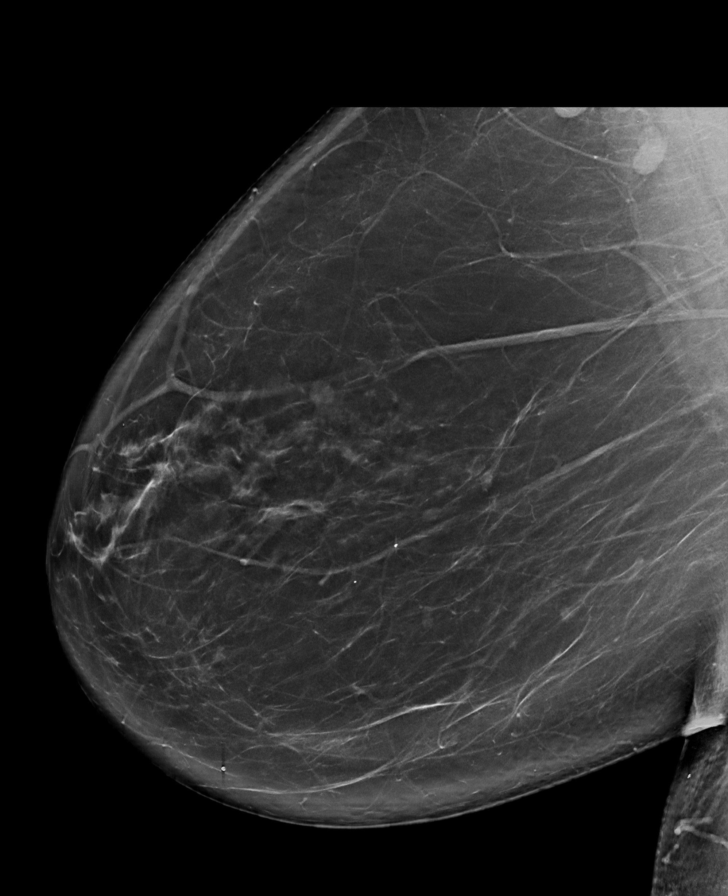

[L MLO synth-2D (1 of 2)]
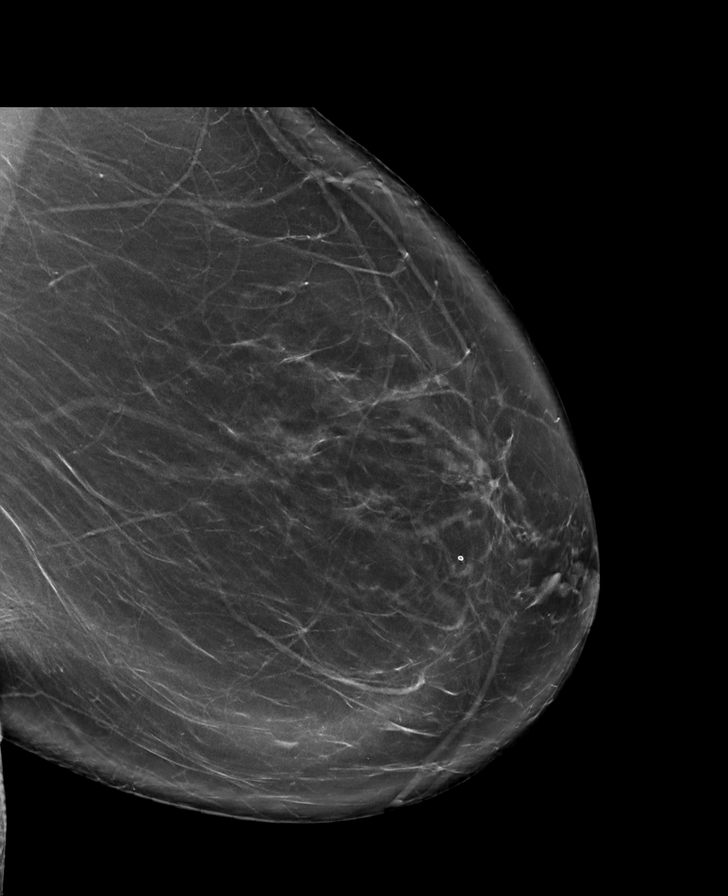

[L MLO synth-2D (2 of 2)]
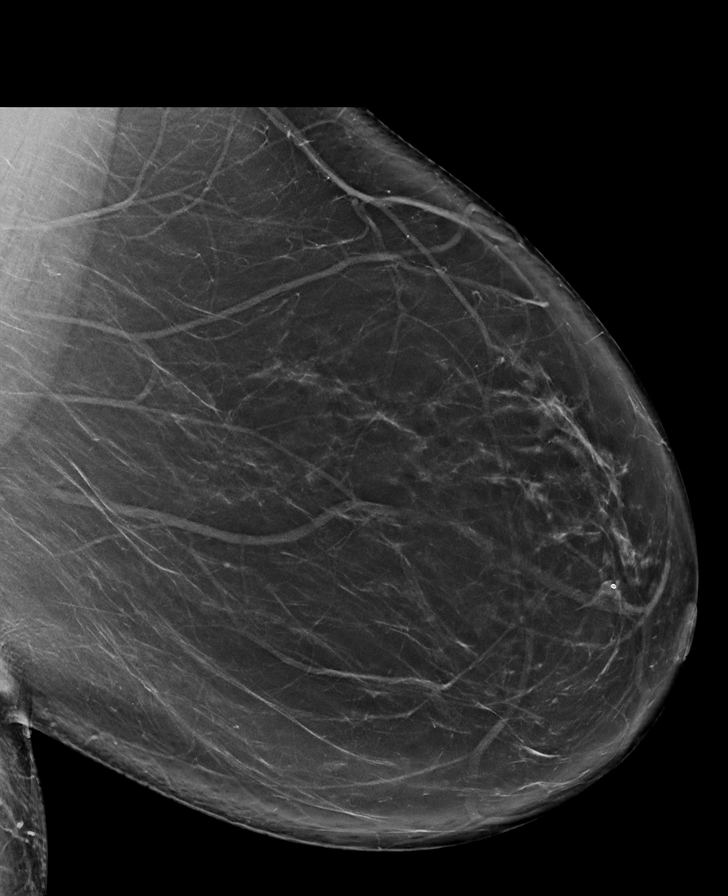

[R MLO tomo · tomo slice 47/94.0]
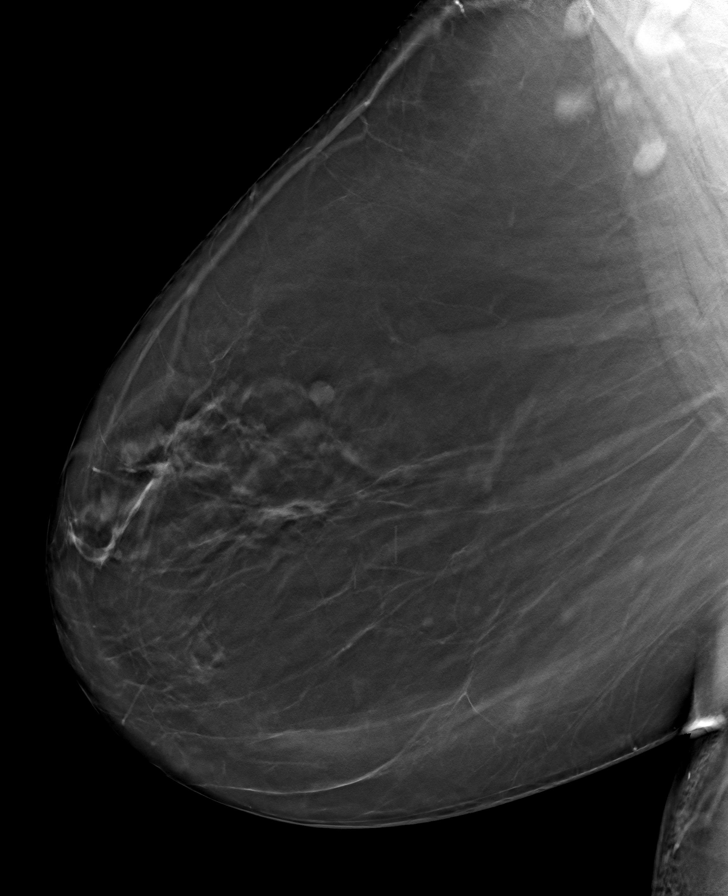

[8 of 40 positions shown; findings below may reference images not displayed]

ACR Breast Density Category b: There are scattered areas of
fibroglandular density.
FINDINGS: There are no findings suspicious for malignancy.
IMPRESSION: No mammographic evidence of malignancy. A result letter of this
screening mammogram will be mailed directly to the patient.

RECOMMENDATION:
Screening mammogram in one year. (Code:51-O-LD2)

BI-RADS CATEGORY  1: Negative.
